# Patient Record
Sex: Male | Born: 1960 | Race: White | Hispanic: No | Marital: Married | Smoking: Never smoker
Health system: Southern US, Community
[De-identification: ages and names within clinical notes are randomized; demographics above are authoritative.]

## PROBLEM LIST (undated history)

## (undated) DIAGNOSIS — M5136 Other intervertebral disc degeneration, lumbar region: Secondary | ICD-10-CM

## (undated) DIAGNOSIS — Z87442 Personal history of urinary calculi: Secondary | ICD-10-CM

## (undated) DIAGNOSIS — F419 Anxiety disorder, unspecified: Secondary | ICD-10-CM

## (undated) DIAGNOSIS — C4490 Unspecified malignant neoplasm of skin, unspecified: Secondary | ICD-10-CM

## (undated) DIAGNOSIS — M51369 Other intervertebral disc degeneration, lumbar region without mention of lumbar back pain or lower extremity pain: Secondary | ICD-10-CM

## (undated) DIAGNOSIS — Z9189 Other specified personal risk factors, not elsewhere classified: Secondary | ICD-10-CM

## (undated) DIAGNOSIS — R112 Nausea with vomiting, unspecified: Secondary | ICD-10-CM

## (undated) DIAGNOSIS — T7840XA Allergy, unspecified, initial encounter: Secondary | ICD-10-CM

## (undated) DIAGNOSIS — M1990 Unspecified osteoarthritis, unspecified site: Secondary | ICD-10-CM

## (undated) DIAGNOSIS — E041 Nontoxic single thyroid nodule: Secondary | ICD-10-CM

## (undated) DIAGNOSIS — Z9889 Other specified postprocedural states: Secondary | ICD-10-CM

## (undated) DIAGNOSIS — R194 Change in bowel habit: Secondary | ICD-10-CM

## (undated) DIAGNOSIS — L570 Actinic keratosis: Secondary | ICD-10-CM

## (undated) DIAGNOSIS — K5900 Constipation, unspecified: Secondary | ICD-10-CM

## (undated) HISTORY — DX: Nontoxic single thyroid nodule: E04.1

## (undated) HISTORY — DX: Other intervertebral disc degeneration, lumbar region: M51.36

## (undated) HISTORY — DX: Constipation, unspecified: K59.00

## (undated) HISTORY — DX: Other intervertebral disc degeneration, lumbar region without mention of lumbar back pain or lower extremity pain: M51.369

## (undated) HISTORY — PX: MEDIAL PARTIAL KNEE REPLACEMENT: SHX5965

## (undated) HISTORY — PX: MOHS SURGERY: SHX181

## (undated) HISTORY — DX: Change in bowel habit: R19.4

## (undated) HISTORY — DX: Anxiety disorder, unspecified: F41.9

## (undated) HISTORY — DX: Unspecified osteoarthritis, unspecified site: M19.90

## (undated) HISTORY — PX: ACHILLES TENDON REPAIR: SUR1153

## (undated) HISTORY — DX: Nausea with vomiting, unspecified: R11.2

## (undated) HISTORY — DX: Allergy, unspecified, initial encounter: T78.40XA

## (undated) HISTORY — DX: Other specified postprocedural states: Z98.890

---

## 2005-05-01 ENCOUNTER — Encounter: Admission: RE | Admit: 2005-05-01 | Discharge: 2005-05-01 | Payer: Self-pay | Admitting: Internal Medicine

## 2005-05-01 ENCOUNTER — Other Ambulatory Visit: Admission: RE | Admit: 2005-05-01 | Discharge: 2005-05-01 | Payer: Self-pay | Admitting: Interventional Radiology

## 2005-05-01 ENCOUNTER — Encounter (INDEPENDENT_AMBULATORY_CARE_PROVIDER_SITE_OTHER): Payer: Self-pay | Admitting: *Deleted

## 2006-12-15 ENCOUNTER — Ambulatory Visit (HOSPITAL_BASED_OUTPATIENT_CLINIC_OR_DEPARTMENT_OTHER): Admission: RE | Admit: 2006-12-15 | Discharge: 2006-12-15 | Payer: Self-pay | Admitting: Orthopedic Surgery

## 2008-06-21 ENCOUNTER — Encounter: Admission: RE | Admit: 2008-06-21 | Discharge: 2008-06-21 | Payer: Self-pay | Admitting: Sports Medicine

## 2009-01-20 HISTORY — PX: FOOT SURGERY: SHX648

## 2009-03-18 ENCOUNTER — Emergency Department (HOSPITAL_COMMUNITY): Admission: EM | Admit: 2009-03-18 | Discharge: 2009-03-18 | Payer: Self-pay | Admitting: Family Medicine

## 2009-09-07 IMAGING — CR DG FOOT COMPLETE 3+V*L*
3 series · 3 of 3 positions shown · non-contrast
Comparison: None

CLINICAL DATA: Left foot pain.

LEFT FOOT - COMPLETE 3+ VIEW

[view not recorded (1 of 3)]
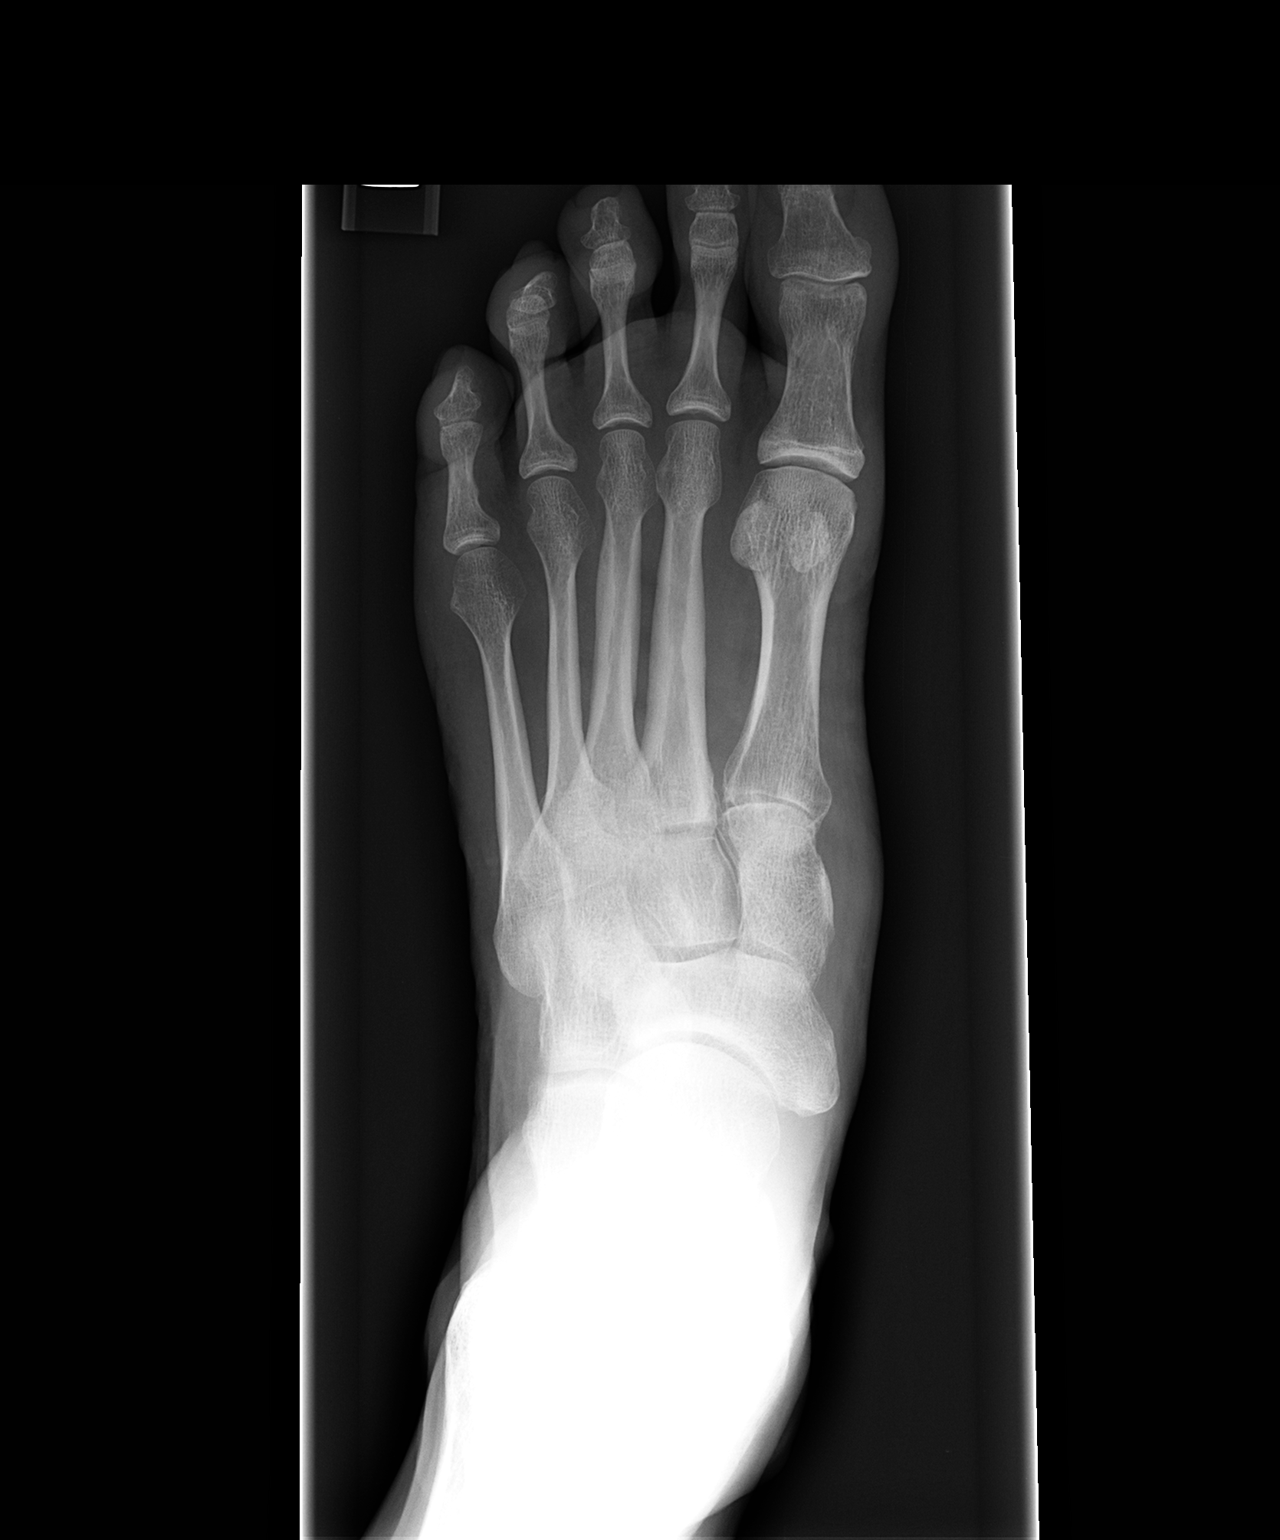

[view not recorded (2 of 3)]
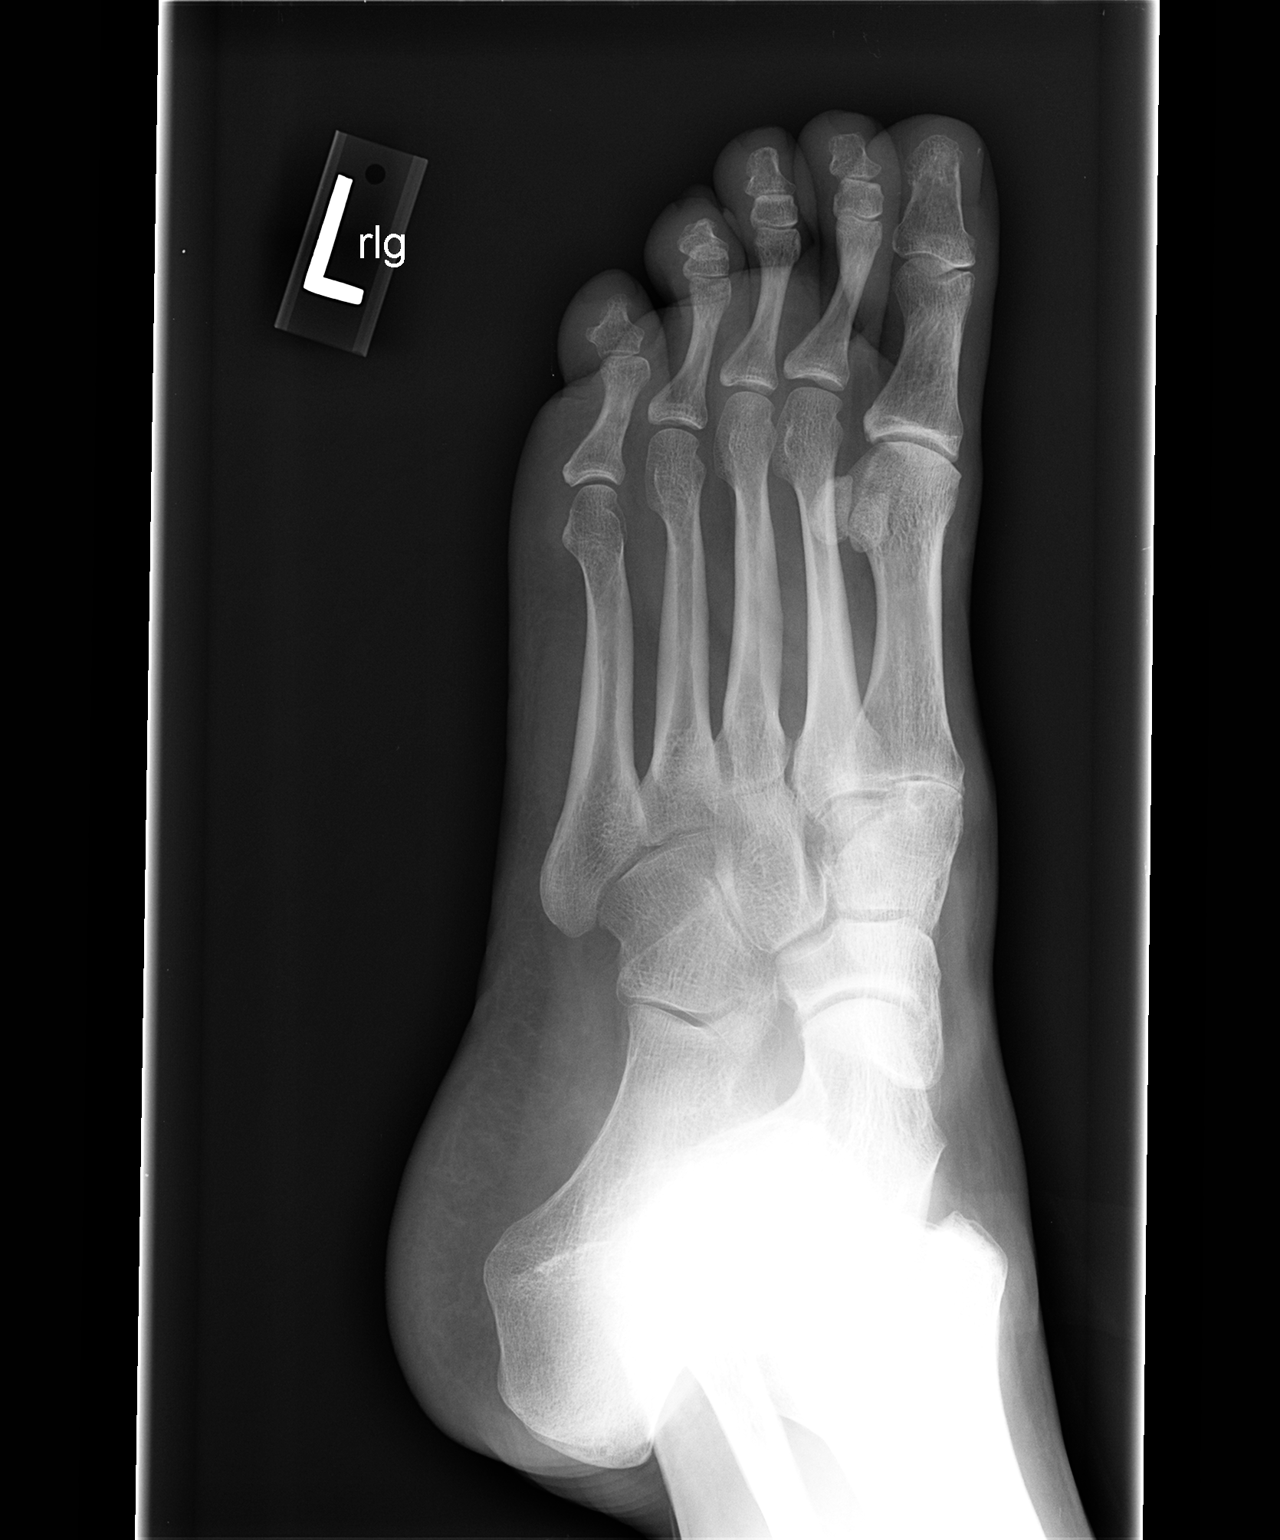

[view not recorded (3 of 3)]
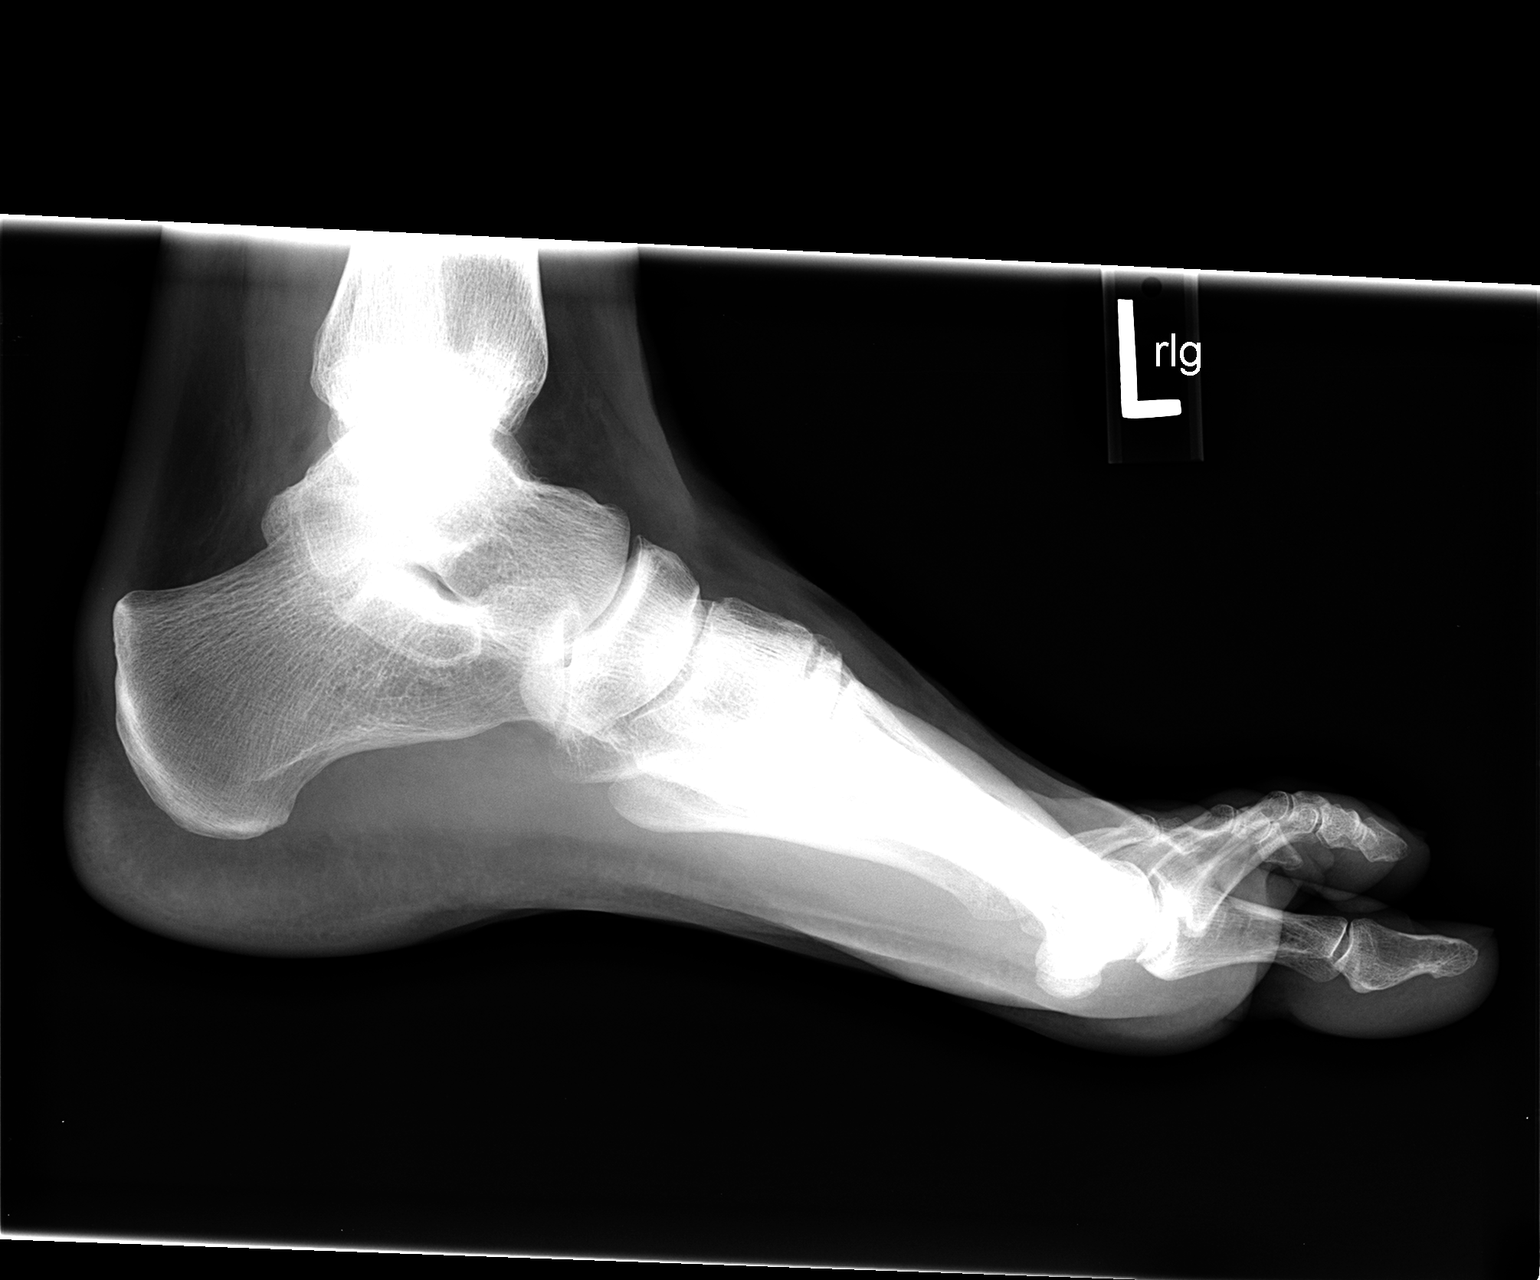

[3 of 3 positions shown; findings below may reference images not displayed]

FINDINGS: Mild degenerative changes are seen at the first tarsal
metatarsal and first metatarsal phalangeal joints.  No fracture.
IMPRESSION: Degenerative changes at the first tarsometatarsal and first
metatarsophalangeal joints.

## 2010-06-04 NOTE — Op Note (Signed)
NAME:  JUNAID, WURZER                  ACCOUNT NO.:  [id number]   MEDICAL RECORD NO.:  [id number]          PATIENT TYPE:  AMB   LOCATION:  [facility]                         FACILITY:  [facility]   PHYSICIAN:  Madlyn Frankel. Charlann Boxer, M.D.  DATE OF BIRTH:  [birth year]   DATE OF PROCEDURE:  12/15/2006  DATE OF DISCHARGE:  12/15/2006                               OPERATIVE REPORT   PREOPERATIVE DIAGNOSIS:  Bilateral knee chondromalacia, patella.   POSTOPERATIVE DIAGNOSIS:  Left knee:  1. Grade 3 chondral defects to the lateral aspect of the medial      femoral condyle weightbearing surface.  2. Small posterior horn medial meniscal tear.  3. Grade 3-4 chondromalacia to the patellofemoral with a quarter-sized      the area in the trochlea of grade 4 change down to bone, with      patella contacted deep, flexion greater than 90 degrees.   PROCEDURES:  1. Left knee diagnostic and operative arthroscopy, medial and      patellofemoral chondroplasty, with trochlear microfracture or      patellofemoral microfracture.  2. Partial medial meniscectomy.  3. Synovectomy.   ASSISTANT:  None.   ANESTHESIA:  General plus locally administered medications.   COMPLICATIONS:  None.   DRAINS:  None.   INDICATIONS FOR PROCEDURE:  Dalton Lopez is a 50 year old male whom I have  been following for bilateral chondromalacia, known patellofemoral  chondromalacia.  He was failing conservative measures of injections of  viscous supplementation, had a history of right knee arthroscopy in the  past with lateral release.  Given the persistence of his symptoms and  healing age, we reviewed conservative surgical options including  arthroscopy.  He is here for bilateral left knee arthroscopies, left  knee first.  Risks and benefits of this type of procedure were reviewed,  consent obtained prior night.   PROCEDURE IN DETAIL:  The patient was brought to operative theater.  Once adequate anesthesia, preoperative antibiotics, Ancef,  administered,  the patient was positioned supine.  Both lower extremities were placed  in leg holders and prepped and draped simultaneously.   Attention was first directed to his left knee.  Following infiltration  of portal sites with 1% lidocaine with epinephrine, standard inferior  lateral, inferior medial, and superior medial portals were utilized.  Diagnostic evaluation of the knee revealed the above-noted findings.  Through an inferior medial portal, this was my working portal the whole  time, first attention was directed to the medial compartment.  The  chondral flap on the lateral aspect of the medial femoral condyle was  addressed with a shaver, taking it back to a stable level.  Probe  examination was carried out.  Posterior horn medial meniscus had a small  radial tear which I bit back with a small biting basket, and then  contoured it to 3.5 with the shaver.  The remainder of this compartment  looked intact.  The ACL was intact.  The lateral compartment intact.   Anteriorly, an extensive synovectomy was carried out, revealing these  changes to the patellofemoral compartment.  The patella was noted  to be  relatively intact, with perhaps some grade 2 change.  At deep flexion at  greater than 90 degrees, there was this area in the trochlea that had  grade 3-4 changes.  I used a 3.5 shaver to debride this area, getting it  back down to about the size of a quarter or a little bit smaller.  I did  perform 5 microfracture holes into this area.  The idea was to stimulate  some fibrocartilage development to fill in this gap.   Reexamination of the knee revealed no evidence of any loose fragments of  cartilage.  Thus, the instrumentation was removed.  I injected the knee  with 30 mL of 0.25% Marcaine with epinephrine.  The portal sites were  reapproximated using 4-0 nylon.  This knee was then dressed into a  sterile bulky wrap on the field.   Attention was now directed to the right  knee, dictated under separate  note.      Madlyn Frankel Charlann Boxer, M.D.  Electronically Signed     MDO/MEDQ  D:  12/17/2006  T:  12/17/2006  Job:  [id number]

## 2010-06-04 NOTE — Op Note (Signed)
NAME:  Dalton Lopez, Dalton Lopez                  ACCOUNT NO.:  [id number]   MEDICAL RECORD NO.:  [id number]          PATIENT TYPE:  AMB   LOCATION:  [facility]                         FACILITY:  [facility]   PHYSICIAN:  Madlyn Frankel. Charlann Boxer, M.D.  DATE OF BIRTH:  [birth year]   DATE OF PROCEDURE:  12/15/2006  DATE OF DISCHARGE:  12/15/2006                               OPERATIVE REPORT   PREOPERATIVE DIAGNOSIS:  Bilateral chondromalacia of patella.   POSTOPERATIVE DIAGNOSIS:  Right knee diffuse chondromalacia of patella  with grade 3-4 changes, grade 3 changes noted in the medial lateral  aspect of the medial femoral condyle, associated with synovitic  response.   PROCEDURE:  Right knee diagnostic and operative arthroscopy with medial  and patellofemoral chondroplasty and synovectomy.   SURGEON:  Madlyn Frankel. Charlann Boxer, M.D.   ASSISTANT:  None.   ANESTHESIA:  General plus locally administered anesthetic.   INDICATIONS FOR PROCEDURE:  The patient is a 50 year old male with  bilateral knee chondromalacia of patella, failing conservative measures  and wishing to proceed with bilateral knee arthroscopies for chondral  debridement as a conservative approach of managing this in a 50 year old  male.  Risks and benefits discussed including, but not limited to the  risks of potential need for future surgery and failure of this type of  surgical approach.  Consent obtained.   PROCEDURE IN DETAIL:  The patient was brought to the operative theater.  As previously noted, both lower extremities were placed in leg holders,  prepped and draped in sterile fashion.  Following the procedure on the  left knee, which was dressed into a sterile wrap, attention was now  directed to the right knee.  Previous portal sites were utilized,  inferior lateral, superior medial, inferior medial.  Diagnostic  evaluation of the knee revealed the above-noted findings.  Inferior  medial portal was utilized.  Attention was first directed with a 3.5  _____________ shaver to debride this area with a small flap on the  lateral aspect of the medial femoral condyle.  It is my assumption that  his index right knee arthroscopy revealed the same findings I found on  the left knee and it was subsequently debrided back to a stable level  given the appearance that it had.  There was a flap present that was  debrided back to a stable level.  At this point, the meniscus was  intact, ACL intact and the lateral compartment intact.   Anterior extensive synovectomy revealed this diffuse grade 3-4 changes  into this femoral trochlear area.  The patella was relatively intact  other than some medial facet changes.  This diffuse change was quite  extensive.  There was no microfracture carried out given the diffuse  area that was covered.  There was no eburnated bone, but there was a  very thin layer of cartilage present.  I debrided back flaps of  cartilage that were present, any unstable cartilage that was there.  Following this, I reexamined the knee to make sure there was no evidence  of any further cartilage.  Once I was satisfied that  there was nothing  further that could be done arthroscopically, I removed the  instrumentation.  The portal sites were reapproximated using 4-0 nylon.  I injected this knee too with 30 mL of quarter percent lidocaine with  epinephrine.   At this point, his knee was draped into a sterile bulky wrap.  He was  brought to the recovery room extubated in stable condition, tolerating  the procedures well.  I will see him back in the office in routine  followup.  We will initiate some physical therapy and try to preserve  these knees as long as possible.      Madlyn Frankel Charlann Boxer, M.D.  Electronically Signed     MDO/MEDQ  D:  12/17/2006  T:  12/17/2006  Job:  [id number]

## 2010-10-21 ENCOUNTER — Other Ambulatory Visit: Payer: Self-pay | Admitting: Dermatology

## 2011-11-21 HISTORY — PX: KNEE ARTHROSCOPY: SUR90

## 2011-11-27 ENCOUNTER — Ambulatory Visit (INDEPENDENT_AMBULATORY_CARE_PROVIDER_SITE_OTHER): Payer: BC Managed Care – PPO

## 2011-11-27 DIAGNOSIS — Z23 Encounter for immunization: Secondary | ICD-10-CM

## 2012-02-16 ENCOUNTER — Other Ambulatory Visit: Payer: Self-pay | Admitting: Dermatology

## 2012-05-17 ENCOUNTER — Other Ambulatory Visit: Payer: Self-pay | Admitting: Dermatology

## 2012-09-23 ENCOUNTER — Encounter: Payer: Self-pay | Admitting: Internal Medicine

## 2012-10-28 ENCOUNTER — Encounter: Payer: BC Managed Care – PPO | Admitting: Internal Medicine

## 2012-11-15 ENCOUNTER — Other Ambulatory Visit: Payer: Self-pay | Admitting: Dermatology

## 2012-11-25 ENCOUNTER — Ambulatory Visit (AMBULATORY_SURGERY_CENTER): Payer: Self-pay

## 2012-11-25 VITALS — Ht 74.0 in | Wt 199.2 lb

## 2012-11-25 DIAGNOSIS — Z1211 Encounter for screening for malignant neoplasm of colon: Secondary | ICD-10-CM

## 2012-11-25 MED ORDER — MOVIPREP 100 G PO SOLR: ORAL | Status: DC

## 2012-11-25 MED ORDER — MOVIPREP 100 G PO SOLR: 1.0000 | Freq: Once | ORAL | Status: DC

## 2012-11-26 ENCOUNTER — Encounter: Payer: Self-pay | Admitting: Internal Medicine

## 2012-12-10 ENCOUNTER — Ambulatory Visit (AMBULATORY_SURGERY_CENTER): Payer: BC Managed Care – PPO | Admitting: Internal Medicine

## 2012-12-10 ENCOUNTER — Encounter: Payer: Self-pay | Admitting: Internal Medicine

## 2012-12-10 VITALS — BP 112/75 | HR 63 | Temp 97.7°F | Resp 20 | Ht 74.0 in | Wt 199.0 lb

## 2012-12-10 DIAGNOSIS — Z1211 Encounter for screening for malignant neoplasm of colon: Secondary | ICD-10-CM

## 2012-12-10 MED ORDER — SODIUM CHLORIDE 0.9 % IV SOLN: 500.0000 mL | INTRAVENOUS | Status: DC

## 2012-12-10 NOTE — Progress Notes (Signed)
   [facility] Anesthesia Post-op Note  Patient: Dalton Lopez  Procedure(s) Performed: colonoscopy  Patient Location: [facility] - Recovery Area  Anesthesia Type: Deep Sedation/Propofol  Level of Consciousness: awake, oriented and patient cooperative  Airway and Oxygen Therapy: Patient Spontanous Breathing  Post-op Pain: none  Post-op Assessment:  Post-op Vital signs reviewed, Patient's Cardiovascular Status Stable, Respiratory Function Stable, Patent Airway, No signs of Nausea or vomiting and Pain level controlled  Post-op Vital Signs: Reviewed and stable  Complications: No apparent anesthesia complications  Blythe Hartshorn E 11:44 AM

## 2012-12-10 NOTE — Op Note (Signed)
[facility] [address] [place], [address]   COLONOSCOPY PROCEDURE REPORT  PATIENT: Dalton Lopez, Dalton Lopez  MR#: [id number] BIRTHDATE: [birth year] , 52  yrs. old GENDER: Male ENDOSCOPIST: Roxy Cedar, MD REFERRED BM:[id number] Jacky Kindle, M.D. PROCEDURE DATE:  12/10/2012 PROCEDURE:   Colonoscopy, screening First Screening Colonoscopy - Avg.  risk and is 50 yrs.  old or older Yes.  Prior Negative Screening - Now for repeat screening. N/A  History of Adenoma - Now for follow-up colonoscopy & has been > or = to 3 yrs.  N/A  Polyps Removed Today? No.  Recommend repeat exam, <10 yrs? No. ASA CLASS:   Class I INDICATIONS:average risk screening. MEDICATIONS: MAC sedation, administered by CRNA and propofol (Diprivan) 550mg  IV  DESCRIPTION OF PROCEDURE:   After the risks benefits and alternatives of the procedure were thoroughly explained, informed consent was obtained.  A digital rectal exam revealed no abnormalities of the rectum.   The LB [id number]  endoscope was introduced through the anus and advanced to the cecum, which was identified by both the appendix and ileocecal valve. No adverse events experienced.   The quality of the prep was good, using MoviPrep  The instrument was then slowly withdrawn as the colon was fully examined.   COLON FINDINGS: A normal appearing cecum, ileocecal valve, and appendiceal orifice were identified.  The ascending, hepatic flexure, transverse, splenic flexure, descending, sigmoid colon and rectum appeared unremarkable.  No polyps or cancers were seen. Retroflexed views revealed internal hemorrhoids. The time to cecum=5 minutes 38 seconds.  Withdrawal time=13 minutes 30 seconds. The scope was withdrawn and the procedure completed. COMPLICATIONS: There were no complications.  ENDOSCOPIC IMPRESSION: 1. Normal colon  RECOMMENDATIONS: 1. Continue current colorectal screening recommendations for "routine risk" patients with a repeat  colonoscopy in 10 years.   eSigned:  Roxy Cedar, MD 12/10/2012 11:41 AM   cc: Geoffry Paradise, MD and The Patient

## 2012-12-10 NOTE — Patient Instructions (Signed)
Normal Colonoscopy  YOU HAD AN ENDOSCOPIC PROCEDURE TODAY AT THE [facility]: Refer to the procedure report that was given to you for any specific questions about what was found during the examination.  If the procedure report does not answer your questions, please call your gastroenterologist to clarify.  If you requested that your care partner not be given the details of your procedure findings, then the procedure report has been included in a sealed envelope for you to review at your convenience later.  YOU SHOULD EXPECT: Some feelings of bloating in the abdomen. Passage of more gas than usual.  Walking can help get rid of the air that was put into your GI tract during the procedure and reduce the bloating. If you had a lower endoscopy (such as a colonoscopy or flexible sigmoidoscopy) you may notice spotting of blood in your stool or on the toilet paper. If you underwent a bowel prep for your procedure, then you may not have a normal bowel movement for a few days.  DIET: Your first meal following the procedure should be a light meal and then it is ok to progress to your normal diet.  A half-sandwich or bowl of soup is an example of a  good first meal.  Heavy or fried foods are harder to digest and may make you feel nauseous or bloated.  Likewise meals heavy in dairy and vegetables can cause extra gas to form and this can also increase the bloating.  Drink plenty of fluids but you should avoid alcoholic beverages for 24 hours.  ACTIVITY: Your care partner should take you home directly after the procedure.  You should plan to take it easy, moving slowly for the rest of the day.  You can resume normal activity the day after the procedure however you should NOT DRIVE or use heavy machinery for 24 hours (because of the sedation medicines used during the test).    SYMPTOMS TO REPORT IMMEDIATELY: A gastroenterologist can be reached at any hour.  During normal business hours, 8:30 AM to 5:00 PM  Monday through Friday, call [phone].  After hours and on weekends, please call the GI answering service at [phone] who will take a message and have the physician on call contact you.   Following lower endoscopy (colonoscopy or flexible sigmoidoscopy):  Excessive amounts of blood in the stool  Significant tenderness or worsening of abdominal pains  Swelling of the abdomen that is new, acute  Fever of 100F or higher  FOLLOW UP: If any biopsies were taken you will be contacted by phone or by letter within the next 1-3 weeks.  Call your gastroenterologist if you have not heard about the biopsies in 3 weeks.  Our staff will call the home number listed on your records the next business day following your procedure to check on you and address any questions or concerns that you may have at that time regarding the information given to you following your procedure. This is a courtesy call and so if there is no answer at the home number and we have not heard from you through the emergency physician on call, we will assume that you have returned to your regular daily activities without incident.  SIGNATURES/CONFIDENTIALITY: You and/or your care partner have signed paperwork which will be entered into your electronic medical record.  These signatures attest to the fact that that the information above on your After Visit Summary has been reviewed and is understood.  Full responsibility of   the confidentiality of this discharge information lies with you and/or your care-partner. 

## 2012-12-10 NOTE — Progress Notes (Signed)
Patient did not have preoperative order for IV antibiotic SSI prophylaxis. (G8918)  Patient did not experience any of the following events: a burn prior to discharge; a fall within the facility; wrong site/side/patient/procedure/implant event; or a hospital transfer or hospital admission upon discharge from the facility. (G8907)  

## 2012-12-13 ENCOUNTER — Telehealth: Payer: Self-pay | Admitting: *Deleted

## 2012-12-13 NOTE — Telephone Encounter (Signed)
  Follow up Call-  Call back number 12/10/2012  Post procedure Call Back phone  # [phone]  Permission to leave phone message Yes     Patient questions:  Do you have a fever, pain , or abdominal swelling? no Pain Score  0 *  Have you tolerated food without any problems? yes  Have you been able to return to your normal activities? yes  Do you have any questions about your discharge instructions: Diet   no Medications  no Follow up visit  no  Do you have questions or concerns about your Care? no  Actions: * If pain score is 4 or above: No action needed, pain <4.

## 2013-05-16 ENCOUNTER — Other Ambulatory Visit: Payer: Self-pay | Admitting: Dermatology

## 2013-06-15 ENCOUNTER — Other Ambulatory Visit: Payer: Self-pay | Admitting: Dermatology

## 2013-06-17 ENCOUNTER — Other Ambulatory Visit: Payer: Self-pay | Admitting: Urology

## 2013-06-21 ENCOUNTER — Other Ambulatory Visit: Payer: Self-pay | Admitting: Urology

## 2013-06-23 ENCOUNTER — Encounter (HOSPITAL_COMMUNITY): Payer: Self-pay | Admitting: Pharmacy Technician

## 2013-07-01 ENCOUNTER — Encounter (HOSPITAL_BASED_OUTPATIENT_CLINIC_OR_DEPARTMENT_OTHER): Payer: Self-pay

## 2013-07-01 ENCOUNTER — Ambulatory Visit (HOSPITAL_BASED_OUTPATIENT_CLINIC_OR_DEPARTMENT_OTHER): Admit: 2013-07-01 | Payer: Self-pay | Admitting: Urology

## 2013-07-01 SURGERY — CYSTOURETEROSCOPY, WITH STENT INSERTION
Anesthesia: General | Laterality: Left

## 2013-07-13 ENCOUNTER — Encounter (HOSPITAL_COMMUNITY): Payer: Self-pay | Admitting: *Deleted

## 2013-07-13 NOTE — Progress Notes (Signed)
Patient notified that ESWL to be rescheduled due to his taking voltaren less than 48 hours prior to procedure. He is very upset and wants to talk to Dr Junious Silk. Phone number provided.

## 2013-07-13 NOTE — Progress Notes (Signed)
History obtained for ESWL on 07/14/13. Patient has taken voltaren 25 mg PO 07/12/13 at 1800. Notified Dr Lyndal Rainbow nurse about this. She will talk to Dr Junious Silk about this and call back.

## 2013-07-13 NOTE — H&P (Signed)
History of Present Illness F/u -  PCP is Dr. Reynaldo Minium.         1-  nephrolithiasis - long history of stones and was on Urocit-K in the past as he had a 24-hour urine collection in Oct 2005. He was advised to start UrocitK, increase fluid intake and limit oxalate. He passed his first stone in 1995.   -Feb 2015 - flank pain, CT A/P - 8 mm right lower pole stone, 6 mm left proximal ureteral stone. I reviewed all the images. KUB - an 8 mm right lower pole stone. The left ureteral stone has now progressed into the left mid ureter over the sacrum. There are multiple pelvic phleboliths.      May 2015 interval hx  Mr. Perkins returns and has not passed a stone. He hasn't had a lot in the way of pain but did note some left lower quadrant discomfort after he stopped tamsulosin.   Past Medical History Problems  1. History of Arthritis (V13.4)  Surgical History Problems  1. History of Arthroscopy Knee Left 2. History of Arthroscopy Knee Right 3. History of Liposuction 4. History of Primary Repair Of Ruptured Achilles Tendon 5. History of Tonsillectomy  Current Meds 1. Diclofenac Sodium TBEC;  Therapy: (Recorded:12Feb2015) to Recorded 2. Fish Oil CAPS;  Therapy: (Recorded:27Jan2009) to Recorded 3. Fluticasone Propionate 50 MCG/ACT Nasal Suspension;  Therapy: 14Apr2014 to Recorded 4. Multi-Vitamin TABS;  Therapy: (Recorded:27Jan2009) to Recorded 5. OxyCODONE HCl CAPS;  Therapy: (Recorded:12Feb2015) to Recorded 6. Potassium Citrate ER 10 MEQ (1080 MG) Oral Tablet Extended Release; TAKE TWO  TABLETS TWICE DAILY AFTER MEALS;  Therapy: 27Jan2009 to (Evaluate:09May2015)  Requested for: [id number]; Last  Rx:14May2014 Ordered 7. Promethazine HCl - 25 MG Oral Tablet; TAKE 1 TABLET EVERY 4 TO 6 HOURS AS  NEEDED FOR NAUSEA;  Therapy: 18HUD1497 to (Evaluate:17Feb2015)  Requested for: 02OVZ8588; Last  Rx:12Feb2015 Ordered 8. Tamsulosin HCl - 0.4 MG Oral Capsule; TAKE 1 CAPSULE Daily;  Therapy: [phone] to (Evaluate:26Apr2015)  Requested for: [phone]; Last  Rx:25Feb2015 Ordered 9. TraMADol HCl - 50 MG Oral Tablet; TAKE 1 TABLET EVERY 6 HOURS AS NEEDED FOR  PAIN;  Therapy: 47SJG2836 to (Evaluate:19Feb2015); Last Rx:12Feb2015 Ordered  Allergies Medication  1. No Known Drug Allergies Non-Medication  2. Pollen  Family History Problems  1. Family history of Family Health Status Number Of Children   One son and one daughter 2. Family history of Nephrolithiasis : Father  Social History Problems  1. Alcohol Use   One a day 2. Caffeine Use   3 per day 3. Former Smoker   Smoked in college 4. Marital History - Currently Married 5. Occupation:   Optician, dispensing Vital Signs [Data Includes: Last 1 Day]  Recorded: 62HUT6546 02:55PM  Blood Pressure: 122 / 77 Temperature: 98.4 F Heart Rate: 68  Physical Exam Constitutional: Well nourished and well developed . No acute distress.  Pulmonary: No respiratory distress and normal respiratory rhythm and effort.  Neuro/Psych:. Mood and affect are appropriate.    Results/Data Urine [Data Includes: Last 1 Day]   [id number]  COLOR YELLOW   APPEARANCE CLOUDY   SPECIFIC GRAVITY 1.015   pH 7.0   GLUCOSE NEG mg/dL  BILIRUBIN NEG   KETONE NEG mg/dL  BLOOD NEG   PROTEIN NEG mg/dL  UROBILINOGEN 0.2 mg/dL  NITRITE NEG   LEUKOCYTE ESTERASE NEG   SQUAMOUS EPITHELIAL/HPF NONE SEEN   WBC 3-6 WBC/hpf  RBC 0-2 RBC/hpf  BACTERIA MODERATE   CRYSTALS NONE SEEN  CASTS NONE SEEN   Other AMORPHOUS NOTED    Procedure KUB today-comparison to prior CT and KUB, findings: The bone and the bowel gas pattern appear normal. The left mid ureteral stone has progressed into the left distal ureter at the ureterovesical junction. No other stones were noted.     Assessment Assessed  1. Kidney stone on right side (592.0) 2. Calculus of left ureter (592.1)  Plan Calculus of left ureter  1. Follow-up Schedule Surgery Office   Follow-up  Status: Complete  Done: [id number] 2. Follow-up Weeks 2-3 Office  Follow-up  Status: Hold For - Appointment,Date of Service   Requested for: 25Feb2015 3. KUB; Status:Hold For - Appointment,Date of Service; Requested for:25Feb2015;  Health Maintenance  4. UA With REFLEX; [Do Not Release]; Status:Complete;   Done: 74QVZ5638 02:38PM Kidney stone on right side  5. KUB; Status:Complete;   Done: 75IEP3295 12:00AM  Discussion/Summary Discussed with patient the stone is at the left ureterovesical junction. I reviewed the images with him. We discussed the nature risk and benefits of restarting alpha blocker and continuing stone passage, ureteroscopy or shockwave lithotripsy. We went over this information in detail. He elected to proceed with ureteroscopy. We discussed risk of ureteral injury, bleeding, infection, failure to gain retrograde access, need for staged procedure/pre-stenting among others. We discussed what to expect with a ureteral stent and that it would be removed with a string or in the office with a camera. On occasion we do not have to leave a stent. All questions answered.        Signatures Electronically signed by : Festus Aloe, M.D.; Jun 16 2013  4:16PM EST

## 2013-07-14 ENCOUNTER — Encounter (HOSPITAL_COMMUNITY)
Admission: RE | Disposition: A | Discharge status: Home / Self Care | Payer: Self-pay | Source: Ambulatory Visit | Attending: Urology

## 2013-07-14 ENCOUNTER — Encounter (HOSPITAL_COMMUNITY): Payer: Self-pay | Admitting: *Deleted

## 2013-07-14 ENCOUNTER — Ambulatory Visit (HOSPITAL_COMMUNITY)
Admission: RE | Admit: 2013-07-14 | Discharge: 2013-07-14 | Disposition: A | Discharge status: Home / Self Care | Payer: BC Managed Care – PPO | Source: Ambulatory Visit | Attending: Urology | Admitting: Urology

## 2013-07-14 ENCOUNTER — Ambulatory Visit (HOSPITAL_COMMUNITY): Payer: BC Managed Care – PPO

## 2013-07-14 DIAGNOSIS — N200 Calculus of kidney: Secondary | ICD-10-CM | POA: Insufficient documentation

## 2013-07-14 DIAGNOSIS — Z87891 Personal history of nicotine dependence: Secondary | ICD-10-CM | POA: Insufficient documentation

## 2013-07-14 DIAGNOSIS — N201 Calculus of ureter: Secondary | ICD-10-CM | POA: Insufficient documentation

## 2013-07-14 DIAGNOSIS — Z79899 Other long term (current) drug therapy: Secondary | ICD-10-CM | POA: Insufficient documentation

## 2013-07-14 HISTORY — DX: Personal history of urinary calculi: Z87.442

## 2013-07-14 SURGERY — LITHOTRIPSY, ESWL
Anesthesia: LOCAL | Laterality: Left

## 2013-07-14 MED ORDER — CEFAZOLIN SODIUM-DEXTROSE 2-3 GM-% IV SOLR: 2.0000 g | INTRAVENOUS | Status: DC

## 2013-07-14 MED ORDER — SODIUM CHLORIDE 0.9 % IV SOLN
INTRAVENOUS | Status: DC
  Administered 2013-07-14: 13:00:00 via INTRAVENOUS

## 2013-07-14 MED ORDER — CIPROFLOXACIN HCL 500 MG PO TABS
500.0000 mg | ORAL_TABLET | ORAL | Status: AC
Start: 2013-07-14 — End: 2013-07-14
  Administered 2013-07-14: 500 mg via ORAL
  Filled 2013-07-14: qty 1

## 2013-07-14 MED ORDER — OXYCODONE-ACETAMINOPHEN 5-325 MG PO TABS: 1.0000 | ORAL_TABLET | Freq: Four times a day (QID) | ORAL | Status: DC | PRN

## 2013-07-14 MED ORDER — DIPHENHYDRAMINE HCL 25 MG PO CAPS
25.0000 mg | ORAL_CAPSULE | ORAL | Status: AC
  Administered 2013-07-14: 25 mg via ORAL
  Filled 2013-07-14: qty 1

## 2013-07-14 MED ORDER — DIAZEPAM 5 MG PO TABS
10.0000 mg | ORAL_TABLET | ORAL | Status: AC
  Administered 2013-07-14: 10 mg via ORAL
  Filled 2013-07-14: qty 2

## 2013-07-14 MED ORDER — DICLOFENAC SODIUM 25 MG PO TBEC: 25.0000 mg | DELAYED_RELEASE_TABLET | Freq: Two times a day (BID) | ORAL | Status: DC

## 2013-07-14 MED ORDER — TAMSULOSIN HCL 0.4 MG PO CAPS: 0.4000 mg | ORAL_CAPSULE | Freq: Every day | ORAL | Status: DC

## 2013-07-14 NOTE — Discharge Instructions (Signed)

## 2013-07-14 NOTE — Op Note (Signed)
See [facility] scanned op note -  Left distal ureteral stone - triangular shape on KUB Left ESWL

## 2013-07-14 NOTE — Interval H&P Note (Signed)
History and Physical Interval Note:  07/14/2013 1:49 PM  Dalton Lopez  has presented today for surgery, with the diagnosis of Left Ureteral Stone  The various methods of treatment have been discussed with the patient and family. After consideration of risks, benefits and other options for treatment, the patient has consented to  Procedure(s): LEFT EXTRACORPOREAL SHOCK WAVE LITHOTRIPSY (ESWL) (Left) as a surgical intervention .  The patient's history has been reviewed, patient examined, no change in status, stable for surgery.  I have reviewed the patient's chart and labs.  Questions were answered to the patient's satisfaction. No fever or dysuria. No stone passage. Todays KUB shows stone stable at left UVJ.    Festus Aloe

## 2013-11-17 ENCOUNTER — Other Ambulatory Visit: Payer: Self-pay | Admitting: Dermatology

## 2013-11-17 HISTORY — PX: SKIN BIOPSY: SHX1

## 2013-12-19 ENCOUNTER — Encounter: Payer: Self-pay | Admitting: Radiation Oncology

## 2013-12-19 NOTE — Progress Notes (Signed)
Histology and Location of Primary Skin Cancer: nose tip  Dalton Lopez presented with the following signs/symptoms,  Follow up on skin cancers   Past/Anticipated interventions by patient's surgeon/dermatologist for current problematic lesion, if any: nose tip  Past skin cancers, if any: Multiple skin cancers including basal cell, squamous cell since 2008: leg, shoulder, back, upper chest, right chest, forehead, upper back, right temple, scalp, right preauricular, nasal tip. History of multiple treatment regimes, procedures.    1) Location/Histology/Intervention:   2) Location/Histology/Intervention:   3) Location/Histology/Intervention:   History of Blistering sunburns, if any: no, history of swimming outdoors x 7 years  SAFETY ISSUES:  Prior radiation? no  Pacemaker/ICD? no  Possible current pregnancy? na  Is the patient on methotrexate? no  Current Complaints / other details:  2 children

## 2013-12-20 ENCOUNTER — Ambulatory Visit
Admission: RE | Admit: 2013-12-20 | Discharge: 2013-12-20 | Disposition: A | Discharge status: Home / Self Care | Payer: 59 | Source: Ambulatory Visit | Attending: Radiation Oncology | Admitting: Radiation Oncology

## 2013-12-20 ENCOUNTER — Encounter: Payer: Self-pay | Admitting: Radiation Oncology

## 2013-12-20 VITALS — BP 126/78 | HR 89 | Temp 98.2°F | Resp 18 | Ht 74.0 in | Wt 203.9 lb

## 2013-12-20 DIAGNOSIS — C44311 Basal cell carcinoma of skin of nose: Secondary | ICD-10-CM | POA: Insufficient documentation

## 2013-12-20 HISTORY — DX: Unspecified malignant neoplasm of skin, unspecified: C44.90

## 2013-12-20 HISTORY — DX: Other specified personal risk factors, not elsewhere classified: Z91.89

## 2013-12-20 HISTORY — DX: Actinic keratosis: L57.0

## 2013-12-20 NOTE — Progress Notes (Signed)
Please see the Nurse Progress Note in the MD Initial Consult Encounter for this patient. 

## 2013-12-20 NOTE — Progress Notes (Signed)
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    HISTORY OF PRESENT ILLNESS:  Dalton Lopez is a 53 y.o. male who is seen today through the courtesy of Dr. Sarajane Jews for evaluation of his basal cell carcinoma of the left nasal tip.  He has a history of multiple head and neck skin carcinomas going back to 2010.  Approximately 2 months ago he noted a lesion along the tip of the nose.  This was biopsied on 11/17/2013 with the diagnosis of cell carcinoma with sclerosis.  Dr. Sarajane Jews offered him Mohs surgery with and without assistance of a plastic surgeon.  He is seen today for discussion of possible radiation therapy.  He is concerned about cosmesis.  PREVIOUS RADIATION THERAPY: No   PAST MEDICAL HISTORY:  has a past medical history of Anxiety; History of kidney stones; Actinic keratosis; History of moderate sun exposure; Arthritis; Allergy; and Skin cancer.     PAST SURGICAL HISTORY:  Past Surgical History  Procedure Laterality Date  . Knee atthrosopic  11/2011    Bil  . Achilles tendon repair      right   . Foot surgery  2011    joint fusion repair with screws left foot  . Skin biopsy  11/17/13    forehead, left temple, central nose tip  . Mohs surgery      multiple     FAMILY HISTORY: family history includes Asthma in his father; Cancer in his paternal grandmother; Skin cancer in his father. There is no history of Colon cancer.   SOCIAL HISTORY:  reports that he has never smoked. He has never used smokeless tobacco. He reports that he drinks about 1.8 oz of alcohol per week. He reports that he does not use illicit drugs. Married, 2 children.  He works in [employer] for a [employer].  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   ALLERGIES: Pollen extract   MEDICATIONS:  Current Outpatient Prescriptions  Medication Sig Dispense Refill  . fluticasone (FLONASE) 50 MCG/ACT nasal spray Place 1 spray into both nostrils daily.    Marland Kitchen loratadine (CLARITIN) 10 MG tablet Take 10 mg by mouth daily.    . meloxicam (MOBIC) 15 MG tablet Take 15 mg by mouth daily.    . Multiple Vitamin (MULTIVITAMIN WITH MINERALS) TABS tablet Take 1 tablet by mouth daily.    . Omega-3 Fatty Acids (FISH OIL) 1200 MG CAPS Take 1,200 mg by mouth daily.     Marland Kitchen oxyCODONE-acetaminophen (ROXICET) 5-325 MG per tablet Take 1-2 tablets by mouth every 6 (six) hours as needed for severe pain. 30 tablet 0  . PARoxetine (PAXIL) 10 MG tablet Take 10 mg by mouth at bedtime.      No current facility-administered medications for this encounter.     REVIEW OF SYSTEMS:  Pertinent items are noted in HPI.    PHYSICAL EXAM:  height is 6\' 2"  (1.88 m) and weight is 203 lb 14.4 oz (92.488 kg). His oral temperature is 98.2 F (36.8 C). His blood pressure is 126/78 and his pulse is 89. His respiration is 18.   On inspection of  the left aspect of the nasal tip there is a small erythematous depression with poorly defined surrounding sclerosis measuring approximately 1 cm in greatest dimensions as best I can determine.     LABORATORY DATA:  Lab Results  Component Value Date   HGB 16.2 12/15/2006   No results found for: NA, K, CL, CO2 No results found for: ALT, AST, GGT, ALKPHOS, BILITOT    IMPRESSION: Basal cell carcinoma of the left tip of the nose.  I explained to the patient that his management options include Mohs surgery or radiation therapy.  I explained to him that cosmesis is generally favorable with Mohs surgery unless there is a large tissue defect.  I do not think that this is the case in this situation.  I would not favor radiation therapy in view of his young age which would place  him at a small risk for development of a radiation-related malignancy at least 10-15 years down the road.  Furthermore, radiation therapy may be indicated  in the event that he is not a candidate for surgery in the distant future for a new or recurrent carcinoma.  He could "burn some bridges" at his relatively young age.  Lastly, I explained to him that cosmesis may be ideal with radiation therapy in terms of thinning of his dermis and development of telangiectasia.  For these reasons I favor surgery.  He tells me he may seek a plastic surgery opinion with Dr. Ralph Leyden at [facility] as suggested by Dr. Sarajane Jews.   PLAN: As above.  I spent 20  minutes face to face with the patient and more than 50% of that time was spent in counseling and/or coordination of care.

## 2014-09-30 DIAGNOSIS — H1010 Acute atopic conjunctivitis, unspecified eye: Secondary | ICD-10-CM

## 2014-09-30 DIAGNOSIS — J309 Allergic rhinitis, unspecified: Principal | ICD-10-CM

## 2014-09-30 IMAGING — CR DG ABDOMEN 1V
1 series · 1 of 1 positions shown · non-contrast
Comparison: None.

CLINICAL DATA: Pre lithotripsy

EXAM:
ABDOMEN - 1 VIEW

[t abdomen supine]
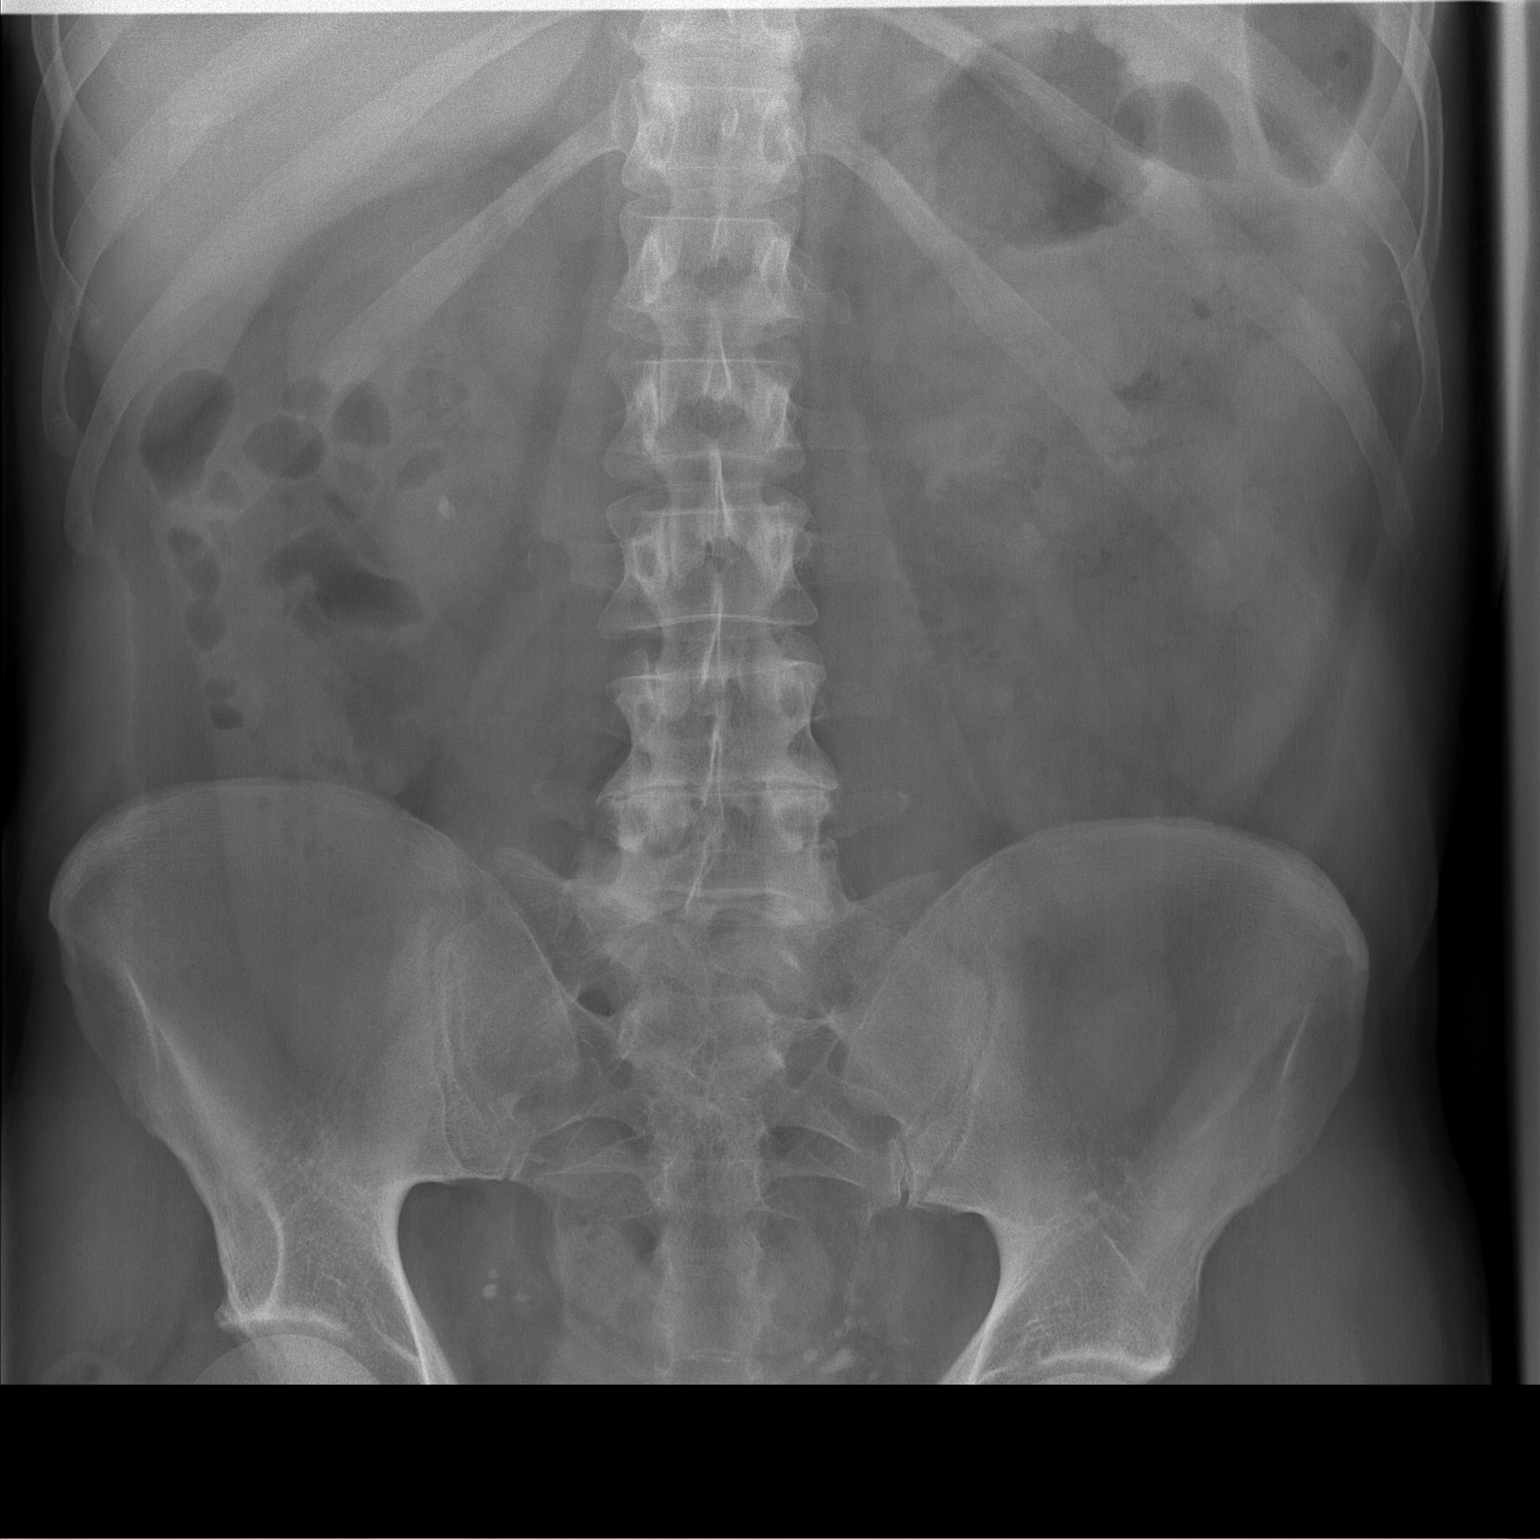

[1 of 1 positions shown; findings below may reference images not displayed]

FINDINGS: A supine film of the abdomen shows a 7 mm right lower pole renal
calculus. No definite left renal calculi are seen. Multiple
calcifications are noted in the bony pelvis, the majority of which
appear most typical of phleboliths, but a distal ureteral calculus
would be difficult to exclude. The bowel gas pattern is nonspecific.
There are degenerative changes at L4-5 and L5-S1 levels.
IMPRESSION: 1. 7 mm calculus overlies the lower pole of the right kidney.
2. No additional renal or ureteral calculi are noted.

## 2014-10-23 ENCOUNTER — Ambulatory Visit (INDEPENDENT_AMBULATORY_CARE_PROVIDER_SITE_OTHER): Payer: 59 | Admitting: *Deleted

## 2014-10-23 DIAGNOSIS — J309 Allergic rhinitis, unspecified: Secondary | ICD-10-CM | POA: Diagnosis not present

## 2014-10-27 ENCOUNTER — Ambulatory Visit (INDEPENDENT_AMBULATORY_CARE_PROVIDER_SITE_OTHER): Payer: 59 | Admitting: Neurology

## 2014-10-27 DIAGNOSIS — J309 Allergic rhinitis, unspecified: Secondary | ICD-10-CM | POA: Diagnosis not present

## 2014-11-01 ENCOUNTER — Ambulatory Visit (INDEPENDENT_AMBULATORY_CARE_PROVIDER_SITE_OTHER): Payer: 59

## 2014-11-01 DIAGNOSIS — J309 Allergic rhinitis, unspecified: Secondary | ICD-10-CM

## 2014-11-10 ENCOUNTER — Ambulatory Visit (INDEPENDENT_AMBULATORY_CARE_PROVIDER_SITE_OTHER): Payer: 59 | Admitting: Neurology

## 2014-11-10 DIAGNOSIS — J309 Allergic rhinitis, unspecified: Secondary | ICD-10-CM | POA: Diagnosis not present

## 2014-11-23 ENCOUNTER — Ambulatory Visit (INDEPENDENT_AMBULATORY_CARE_PROVIDER_SITE_OTHER): Payer: 59

## 2014-11-23 DIAGNOSIS — J309 Allergic rhinitis, unspecified: Secondary | ICD-10-CM | POA: Diagnosis not present

## 2014-12-29 ENCOUNTER — Ambulatory Visit (INDEPENDENT_AMBULATORY_CARE_PROVIDER_SITE_OTHER): Payer: 59 | Admitting: *Deleted

## 2014-12-29 DIAGNOSIS — J309 Allergic rhinitis, unspecified: Secondary | ICD-10-CM | POA: Diagnosis not present

## 2015-01-24 ENCOUNTER — Ambulatory Visit (INDEPENDENT_AMBULATORY_CARE_PROVIDER_SITE_OTHER): Payer: 59

## 2015-01-24 DIAGNOSIS — J309 Allergic rhinitis, unspecified: Secondary | ICD-10-CM

## 2015-01-26 DIAGNOSIS — J3089 Other allergic rhinitis: Secondary | ICD-10-CM | POA: Diagnosis not present

## 2015-01-29 DIAGNOSIS — J301 Allergic rhinitis due to pollen: Secondary | ICD-10-CM | POA: Diagnosis not present

## 2015-02-23 ENCOUNTER — Telehealth: Payer: Self-pay | Admitting: Allergy and Immunology

## 2015-02-23 NOTE — Telephone Encounter (Signed)
Called pt back - his injections are his copay $25.54

## 2015-02-23 NOTE — Telephone Encounter (Signed)
Insurance changed on October 1st and he wants to know how much the insurance is covering on injections now with the new insurance.

## 2015-03-06 ENCOUNTER — Ambulatory Visit (INDEPENDENT_AMBULATORY_CARE_PROVIDER_SITE_OTHER): Payer: 59 | Admitting: *Deleted

## 2015-03-06 DIAGNOSIS — J309 Allergic rhinitis, unspecified: Secondary | ICD-10-CM | POA: Diagnosis not present

## 2015-03-26 ENCOUNTER — Ambulatory Visit (INDEPENDENT_AMBULATORY_CARE_PROVIDER_SITE_OTHER): Payer: 59

## 2015-03-26 DIAGNOSIS — J309 Allergic rhinitis, unspecified: Secondary | ICD-10-CM | POA: Diagnosis not present

## 2015-04-27 ENCOUNTER — Ambulatory Visit (INDEPENDENT_AMBULATORY_CARE_PROVIDER_SITE_OTHER): Payer: 59 | Admitting: *Deleted

## 2015-04-27 DIAGNOSIS — J309 Allergic rhinitis, unspecified: Secondary | ICD-10-CM

## 2015-06-01 ENCOUNTER — Ambulatory Visit (INDEPENDENT_AMBULATORY_CARE_PROVIDER_SITE_OTHER): Payer: 59 | Admitting: *Deleted

## 2015-06-01 DIAGNOSIS — J309 Allergic rhinitis, unspecified: Secondary | ICD-10-CM | POA: Diagnosis not present

## 2015-06-22 ENCOUNTER — Ambulatory Visit (INDEPENDENT_AMBULATORY_CARE_PROVIDER_SITE_OTHER): Payer: 59 | Admitting: *Deleted

## 2015-06-22 DIAGNOSIS — J309 Allergic rhinitis, unspecified: Secondary | ICD-10-CM | POA: Diagnosis not present

## 2015-07-05 ENCOUNTER — Ambulatory Visit (INDEPENDENT_AMBULATORY_CARE_PROVIDER_SITE_OTHER): Payer: 59

## 2015-07-05 DIAGNOSIS — J309 Allergic rhinitis, unspecified: Secondary | ICD-10-CM

## 2015-07-25 ENCOUNTER — Ambulatory Visit (INDEPENDENT_AMBULATORY_CARE_PROVIDER_SITE_OTHER): Payer: 59

## 2015-07-25 DIAGNOSIS — J309 Allergic rhinitis, unspecified: Secondary | ICD-10-CM

## 2015-07-30 ENCOUNTER — Ambulatory Visit (INDEPENDENT_AMBULATORY_CARE_PROVIDER_SITE_OTHER): Payer: 59 | Admitting: *Deleted

## 2015-07-30 DIAGNOSIS — J309 Allergic rhinitis, unspecified: Secondary | ICD-10-CM | POA: Diagnosis not present

## 2015-08-23 ENCOUNTER — Ambulatory Visit (INDEPENDENT_AMBULATORY_CARE_PROVIDER_SITE_OTHER): Payer: 59

## 2015-08-23 DIAGNOSIS — J309 Allergic rhinitis, unspecified: Secondary | ICD-10-CM | POA: Diagnosis not present

## 2015-08-31 ENCOUNTER — Ambulatory Visit (INDEPENDENT_AMBULATORY_CARE_PROVIDER_SITE_OTHER): Payer: 59 | Admitting: *Deleted

## 2015-08-31 DIAGNOSIS — J309 Allergic rhinitis, unspecified: Secondary | ICD-10-CM

## 2015-09-03 ENCOUNTER — Ambulatory Visit (INDEPENDENT_AMBULATORY_CARE_PROVIDER_SITE_OTHER): Payer: 59 | Admitting: *Deleted

## 2015-09-03 DIAGNOSIS — J309 Allergic rhinitis, unspecified: Secondary | ICD-10-CM

## 2015-09-14 ENCOUNTER — Ambulatory Visit (INDEPENDENT_AMBULATORY_CARE_PROVIDER_SITE_OTHER): Payer: 59

## 2015-09-14 DIAGNOSIS — J309 Allergic rhinitis, unspecified: Secondary | ICD-10-CM

## 2015-09-17 ENCOUNTER — Ambulatory Visit (INDEPENDENT_AMBULATORY_CARE_PROVIDER_SITE_OTHER): Payer: 59

## 2015-09-17 DIAGNOSIS — J309 Allergic rhinitis, unspecified: Secondary | ICD-10-CM

## 2015-09-18 DIAGNOSIS — J3089 Other allergic rhinitis: Secondary | ICD-10-CM | POA: Diagnosis not present

## 2015-09-19 DIAGNOSIS — J301 Allergic rhinitis due to pollen: Secondary | ICD-10-CM | POA: Diagnosis not present

## 2015-11-05 ENCOUNTER — Ambulatory Visit (INDEPENDENT_AMBULATORY_CARE_PROVIDER_SITE_OTHER): Payer: 59 | Admitting: *Deleted

## 2015-11-05 DIAGNOSIS — J309 Allergic rhinitis, unspecified: Secondary | ICD-10-CM | POA: Diagnosis not present

## 2015-11-27 ENCOUNTER — Ambulatory Visit (INDEPENDENT_AMBULATORY_CARE_PROVIDER_SITE_OTHER): Payer: 59

## 2015-11-27 DIAGNOSIS — J309 Allergic rhinitis, unspecified: Secondary | ICD-10-CM | POA: Diagnosis not present

## 2015-12-26 ENCOUNTER — Ambulatory Visit (INDEPENDENT_AMBULATORY_CARE_PROVIDER_SITE_OTHER): Payer: 59 | Admitting: *Deleted

## 2015-12-26 DIAGNOSIS — J309 Allergic rhinitis, unspecified: Secondary | ICD-10-CM | POA: Diagnosis not present

## 2016-01-22 ENCOUNTER — Ambulatory Visit (INDEPENDENT_AMBULATORY_CARE_PROVIDER_SITE_OTHER): Payer: 59 | Admitting: *Deleted

## 2016-01-22 DIAGNOSIS — J309 Allergic rhinitis, unspecified: Secondary | ICD-10-CM

## 2016-01-30 ENCOUNTER — Ambulatory Visit (INDEPENDENT_AMBULATORY_CARE_PROVIDER_SITE_OTHER): Payer: 59 | Admitting: *Deleted

## 2016-01-30 DIAGNOSIS — J309 Allergic rhinitis, unspecified: Secondary | ICD-10-CM

## 2016-02-20 ENCOUNTER — Ambulatory Visit (INDEPENDENT_AMBULATORY_CARE_PROVIDER_SITE_OTHER): Payer: 59 | Admitting: *Deleted

## 2016-02-20 DIAGNOSIS — J309 Allergic rhinitis, unspecified: Secondary | ICD-10-CM | POA: Diagnosis not present

## 2016-02-25 NOTE — Addendum Note (Signed)
Addended by: Felipa Emory on: 02/25/2016 02:06 PM   Modules accepted: Orders

## 2016-02-26 ENCOUNTER — Ambulatory Visit (INDEPENDENT_AMBULATORY_CARE_PROVIDER_SITE_OTHER): Payer: 59

## 2016-02-26 DIAGNOSIS — J309 Allergic rhinitis, unspecified: Secondary | ICD-10-CM | POA: Diagnosis not present

## 2016-03-04 ENCOUNTER — Ambulatory Visit (INDEPENDENT_AMBULATORY_CARE_PROVIDER_SITE_OTHER): Payer: 59 | Admitting: *Deleted

## 2016-03-04 DIAGNOSIS — J309 Allergic rhinitis, unspecified: Secondary | ICD-10-CM | POA: Diagnosis not present

## 2016-03-13 ENCOUNTER — Ambulatory Visit (INDEPENDENT_AMBULATORY_CARE_PROVIDER_SITE_OTHER): Payer: 59 | Admitting: *Deleted

## 2016-03-13 DIAGNOSIS — J309 Allergic rhinitis, unspecified: Secondary | ICD-10-CM | POA: Diagnosis not present

## 2016-03-24 DIAGNOSIS — D485 Neoplasm of uncertain behavior of skin: Secondary | ICD-10-CM | POA: Diagnosis not present

## 2016-03-24 DIAGNOSIS — L821 Other seborrheic keratosis: Secondary | ICD-10-CM | POA: Diagnosis not present

## 2016-03-24 DIAGNOSIS — L905 Scar conditions and fibrosis of skin: Secondary | ICD-10-CM | POA: Diagnosis not present

## 2016-03-24 DIAGNOSIS — L570 Actinic keratosis: Secondary | ICD-10-CM | POA: Diagnosis not present

## 2016-03-24 DIAGNOSIS — C44519 Basal cell carcinoma of skin of other part of trunk: Secondary | ICD-10-CM | POA: Diagnosis not present

## 2016-03-24 DIAGNOSIS — L111 Transient acantholytic dermatosis [Grover]: Secondary | ICD-10-CM | POA: Diagnosis not present

## 2016-03-24 DIAGNOSIS — Z85828 Personal history of other malignant neoplasm of skin: Secondary | ICD-10-CM | POA: Diagnosis not present

## 2016-04-01 ENCOUNTER — Ambulatory Visit (INDEPENDENT_AMBULATORY_CARE_PROVIDER_SITE_OTHER): Payer: 59 | Admitting: *Deleted

## 2016-04-01 DIAGNOSIS — J309 Allergic rhinitis, unspecified: Secondary | ICD-10-CM | POA: Diagnosis not present

## 2016-04-09 DIAGNOSIS — C44519 Basal cell carcinoma of skin of other part of trunk: Secondary | ICD-10-CM | POA: Diagnosis not present

## 2016-04-22 ENCOUNTER — Ambulatory Visit (INDEPENDENT_AMBULATORY_CARE_PROVIDER_SITE_OTHER): Payer: 59 | Admitting: *Deleted

## 2016-04-22 DIAGNOSIS — J309 Allergic rhinitis, unspecified: Secondary | ICD-10-CM | POA: Diagnosis not present

## 2016-05-13 ENCOUNTER — Ambulatory Visit (INDEPENDENT_AMBULATORY_CARE_PROVIDER_SITE_OTHER): Payer: 59 | Admitting: Allergy and Immunology

## 2016-05-13 ENCOUNTER — Encounter: Payer: Self-pay | Admitting: Allergy and Immunology

## 2016-05-13 VITALS — BP 124/88 | HR 64 | Resp 16 | Ht 74.33 in | Wt 206.4 lb

## 2016-05-13 DIAGNOSIS — H1045 Other chronic allergic conjunctivitis: Secondary | ICD-10-CM

## 2016-05-13 DIAGNOSIS — J3089 Other allergic rhinitis: Secondary | ICD-10-CM

## 2016-05-13 DIAGNOSIS — H1010 Acute atopic conjunctivitis, unspecified eye: Secondary | ICD-10-CM

## 2016-05-13 MED ORDER — METHYLPREDNISOLONE ACETATE 80 MG/ML IJ SUSP
80.0000 mg | Freq: Once | INTRAMUSCULAR | Status: AC
  Administered 2016-05-13: 80 mg via INTRAMUSCULAR

## 2016-05-13 MED ORDER — FLUTICASONE PROPIONATE 50 MCG/ACT NA SUSP: 1.0000 | Freq: Every day | NASAL | 5 refills | Status: DC

## 2016-05-13 MED ORDER — OLOPATADINE HCL 0.7 % OP SOLN: 1.0000 [drp] | Freq: Every day | OPHTHALMIC | 5 refills | Status: DC

## 2016-05-13 MED ORDER — EPINEPHRINE 0.3 MG/0.3ML IJ SOAJ: 0.3000 mg | Freq: Once | INTRAMUSCULAR | 3 refills | Status: DC | PRN

## 2016-05-13 MED ORDER — MONTELUKAST SODIUM 10 MG PO TABS: 10.0000 mg | ORAL_TABLET | Freq: Every day | ORAL | 5 refills | Status: DC

## 2016-05-13 NOTE — Progress Notes (Signed)
Follow-up Note  Referring Provider: Burnard Bunting, MD Primary Provider: Geoffery Lyons, MD Date of Office Visit: 05/13/2016  Subjective:   Dalton Lopez (DOB: 26-Nov-1960) is a 56 y.o. male who returns to the [facility] on 05/13/2016 in re-evaluation of the following:  HPI: Dalton Lopez returns to this clinic in reevaluation of his allergic rhinoconjunctivitis treated with immunotherapy. It is been over 2 years since I have seen him in this clinic.  His immunotherapy is going quite well. He is presently using immunotherapy every 3 weeks. He can have exposure to his dog now with no problem whatsoever.  However, he still has springtime and fall time flareups of his eye and nose issue with nasal congestion and sneezing and itchy red watery eyes even in the face of using Claritin and Singulair and Flonase. He thought that he was doing better with his immunotherapy regarding his spring and fall control until the past year or so.  Allergies as of 05/13/2016   No Known Allergies     Medication List      EPIPEN 2-PAK 0.3 mg/0.3 mL Soaj injection Generic drug:  EPINEPHrine Inject 0.3 mg into the muscle Once PRN.   fexofenadine 180 MG tablet Commonly known as:  ALLEGRA Take 180 mg by mouth daily.   Fish Oil 1200 MG Caps Take 1,200 mg by mouth daily.   fluticasone 50 MCG/ACT nasal spray Commonly known as:  FLONASE Place 1 spray into both nostrils daily.   meloxicam 15 MG tablet Commonly known as:  MOBIC Take 15 mg by mouth 2 (two) times daily.   montelukast 10 MG tablet Commonly known as:  SINGULAIR Take 10 mg by mouth.   multivitamin with minerals Tabs tablet Take 1 tablet by mouth daily.   PARoxetine 20 MG tablet Commonly known as:  PAXIL Take 20 mg by mouth daily.   SUDAFED PO Take by mouth.   TYLENOL PO Take by mouth.       Past Medical History:  Diagnosis Date  . Actinic keratosis   . Allergy    seasonal  . Anxiety   . Arthritis    bil knees, back  . History of kidney stones   . History of moderate sun exposure   . Skin cancer    basal cell , squamous cell    Past Surgical History:  Procedure Laterality Date  . ACHILLES TENDON REPAIR     right   . FOOT SURGERY  2011   joint fusion repair with screws left foot  . knee atthrosopic  11/2011   Bil  . MOHS SURGERY     multiple  . SKIN BIOPSY  11/17/13   forehead, left temple, central nose tip    Review of systems negative except as noted in HPI / PMHx or noted below:  Review of Systems  Constitutional: Negative.   HENT: Negative.   Eyes: Negative.   Respiratory: Negative.   Cardiovascular: Negative.   Gastrointestinal: Negative.   Genitourinary: Negative.   Musculoskeletal: Negative.   Skin: Negative.   Neurological: Negative.   Endo/Heme/Allergies: Negative.   Psychiatric/Behavioral: Negative.      Objective:   Vitals:   05/13/16 1532  BP: 124/88  Pulse: 64  Resp: 16   Height: 6' 2.33" (188.8 cm)  Weight: 206 lb 6.4 oz (93.6 kg)   Physical Exam  Constitutional: He is well-developed, well-nourished, and in no distress.  HENT:  Head: Normocephalic.  Right Ear: Tympanic membrane, external ear and ear  canal normal.  Left Ear: Tympanic membrane, external ear and ear canal normal.  Nose: Mucosal edema present. No rhinorrhea.  Mouth/Throat: Uvula is midline, oropharynx is clear and moist and mucous membranes are normal. No oropharyngeal exudate.  Eyes: Right conjunctiva is injected. Left conjunctiva is injected.  Neck: Trachea normal. No tracheal tenderness present. No tracheal deviation present. No thyromegaly present.  Cardiovascular: Normal rate, regular rhythm, S1 normal, S2 normal and normal heart sounds.   No murmur heard. Pulmonary/Chest: Breath sounds normal. No stridor. No respiratory distress. He has no wheezes. He has no rales.  Musculoskeletal: He exhibits no edema.  Lymphadenopathy:       Head (right side): No tonsillar  adenopathy present.       Head (left side): No tonsillar adenopathy present.    He has no cervical adenopathy.  Neurological: He is alert. Gait normal.  Skin: No rash noted. He is not diaphoretic. No erythema. Nails show no clubbing.  Psychiatric: Mood and affect normal.    Diagnostics: none   Assessment and Plan:   1. Other allergic rhinitis   2. Seasonal allergic conjunctivitis     1. Continue immunotherapy for now  2. Arrange for skin testing without antihistamines when spring flare is over  3. Depo-Medrol 80 IM delivered in clinic  4. Continue a combination of the following:   A. Flonase 1-2 sprays each nostril one time per day  B. montelukast 10 mg daily  5. If needed:   A. OTC antihistamine - Zyrtec/Claritin/Allegra 1-2 times a day  B. Pazeo 1 drop each eye one time per day. Coupon  6. Further evaluation and treatment?  I think we need to determine if Dalton Lopez has developed different types of allergies that are not covered by his immunotherapy and we will arrange for skin testing once the springtime season is over. I did give him a systemic steroid today to help with his rather significant springtime flare and he will continue to use anti-inflammatory agents for his respiratory tract as noted above. Further evaluation and treatment will be based upon his response and results of his repeat skin testing.  Allena Katz, MD Allergy / Immunology [place]

## 2016-05-13 NOTE — Patient Instructions (Addendum)
  1. Continue immunotherapy for now  2. Arrange for skin testing without antihistamines when spring flare is over  3. Depo-Medrol 80 IM delivered in clinic  4. Continue a combination of the following:   A. Flonase 1-2 sprays each nostril one time per day  B. montelukast 10 mg daily  5. If needed:   A. OTC antihistamine - Zyrtec/Claritin/Allegra 1-2 times a day  B. Pazeo 1 drop each eye one time per day. Coupon  6. Further evaluation and treatment?

## 2016-05-15 ENCOUNTER — Telehealth: Payer: Self-pay | Admitting: *Deleted

## 2016-05-15 MED ORDER — OLOPATADINE HCL 0.1 % OP SOLN: 1.0000 [drp] | Freq: Two times a day (BID) | OPHTHALMIC | 3 refills | Status: DC

## 2016-05-15 NOTE — Telephone Encounter (Signed)
Patanol.   

## 2016-05-15 NOTE — Telephone Encounter (Signed)
Patanol sent to pharmacy. Left message to advise pt.

## 2016-05-15 NOTE — Telephone Encounter (Signed)
Could you please advise what dose to send in

## 2016-05-15 NOTE — Telephone Encounter (Signed)
Pharmacy called [facility] is not covered. Insurance prefers generic optivar and patanol please advise

## 2016-05-15 NOTE — Telephone Encounter (Signed)
One drop each eye two times a day

## 2016-05-28 ENCOUNTER — Encounter: Payer: Self-pay | Admitting: *Deleted

## 2016-05-30 DIAGNOSIS — J3089 Other allergic rhinitis: Secondary | ICD-10-CM | POA: Diagnosis not present

## 2016-06-12 ENCOUNTER — Ambulatory Visit (INDEPENDENT_AMBULATORY_CARE_PROVIDER_SITE_OTHER): Payer: 59 | Admitting: *Deleted

## 2016-06-12 DIAGNOSIS — J309 Allergic rhinitis, unspecified: Secondary | ICD-10-CM | POA: Diagnosis not present

## 2016-06-17 ENCOUNTER — Ambulatory Visit (INDEPENDENT_AMBULATORY_CARE_PROVIDER_SITE_OTHER): Payer: 59

## 2016-06-17 DIAGNOSIS — J309 Allergic rhinitis, unspecified: Secondary | ICD-10-CM

## 2016-07-01 ENCOUNTER — Ambulatory Visit (INDEPENDENT_AMBULATORY_CARE_PROVIDER_SITE_OTHER): Payer: 59 | Admitting: *Deleted

## 2016-07-01 DIAGNOSIS — J309 Allergic rhinitis, unspecified: Secondary | ICD-10-CM | POA: Diagnosis not present

## 2016-07-07 DIAGNOSIS — L910 Hypertrophic scar: Secondary | ICD-10-CM | POA: Diagnosis not present

## 2016-08-06 ENCOUNTER — Ambulatory Visit (INDEPENDENT_AMBULATORY_CARE_PROVIDER_SITE_OTHER): Payer: 59

## 2016-08-06 DIAGNOSIS — J309 Allergic rhinitis, unspecified: Secondary | ICD-10-CM | POA: Diagnosis not present

## 2016-08-25 ENCOUNTER — Ambulatory Visit (INDEPENDENT_AMBULATORY_CARE_PROVIDER_SITE_OTHER): Payer: 59

## 2016-08-25 DIAGNOSIS — J309 Allergic rhinitis, unspecified: Secondary | ICD-10-CM | POA: Diagnosis not present

## 2016-09-02 ENCOUNTER — Ambulatory Visit (INDEPENDENT_AMBULATORY_CARE_PROVIDER_SITE_OTHER): Payer: 59 | Admitting: *Deleted

## 2016-09-02 DIAGNOSIS — J309 Allergic rhinitis, unspecified: Secondary | ICD-10-CM

## 2016-09-09 ENCOUNTER — Ambulatory Visit (INDEPENDENT_AMBULATORY_CARE_PROVIDER_SITE_OTHER): Payer: 59 | Admitting: *Deleted

## 2016-09-09 DIAGNOSIS — J309 Allergic rhinitis, unspecified: Secondary | ICD-10-CM

## 2016-09-17 ENCOUNTER — Ambulatory Visit (INDEPENDENT_AMBULATORY_CARE_PROVIDER_SITE_OTHER): Payer: 59

## 2016-09-17 DIAGNOSIS — J309 Allergic rhinitis, unspecified: Secondary | ICD-10-CM

## 2016-09-24 DIAGNOSIS — D0461 Carcinoma in situ of skin of right upper limb, including shoulder: Secondary | ICD-10-CM | POA: Diagnosis not present

## 2016-09-24 DIAGNOSIS — Z85828 Personal history of other malignant neoplasm of skin: Secondary | ICD-10-CM | POA: Diagnosis not present

## 2016-09-24 DIAGNOSIS — L910 Hypertrophic scar: Secondary | ICD-10-CM | POA: Diagnosis not present

## 2016-09-24 DIAGNOSIS — L814 Other melanin hyperpigmentation: Secondary | ICD-10-CM | POA: Diagnosis not present

## 2016-09-24 DIAGNOSIS — C44619 Basal cell carcinoma of skin of left upper limb, including shoulder: Secondary | ICD-10-CM | POA: Diagnosis not present

## 2016-09-25 ENCOUNTER — Ambulatory Visit (INDEPENDENT_AMBULATORY_CARE_PROVIDER_SITE_OTHER): Payer: 59 | Admitting: *Deleted

## 2016-09-25 DIAGNOSIS — J309 Allergic rhinitis, unspecified: Secondary | ICD-10-CM

## 2016-10-15 DIAGNOSIS — Z0000 Encounter for general adult medical examination without abnormal findings: Secondary | ICD-10-CM | POA: Diagnosis not present

## 2016-10-20 ENCOUNTER — Ambulatory Visit (INDEPENDENT_AMBULATORY_CARE_PROVIDER_SITE_OTHER): Payer: 59 | Admitting: *Deleted

## 2016-10-20 DIAGNOSIS — J309 Allergic rhinitis, unspecified: Secondary | ICD-10-CM | POA: Diagnosis not present

## 2016-10-22 DIAGNOSIS — H9190 Unspecified hearing loss, unspecified ear: Secondary | ICD-10-CM | POA: Diagnosis not present

## 2016-10-22 DIAGNOSIS — M5380 Other specified dorsopathies, site unspecified: Secondary | ICD-10-CM | POA: Diagnosis not present

## 2016-10-22 DIAGNOSIS — M1990 Unspecified osteoarthritis, unspecified site: Secondary | ICD-10-CM | POA: Diagnosis not present

## 2016-10-22 DIAGNOSIS — Z0000 Encounter for general adult medical examination without abnormal findings: Secondary | ICD-10-CM | POA: Diagnosis not present

## 2016-10-22 DIAGNOSIS — Z1389 Encounter for screening for other disorder: Secondary | ICD-10-CM | POA: Diagnosis not present

## 2016-10-24 DIAGNOSIS — J302 Other seasonal allergic rhinitis: Secondary | ICD-10-CM | POA: Diagnosis not present

## 2016-10-31 DIAGNOSIS — Z1212 Encounter for screening for malignant neoplasm of rectum: Secondary | ICD-10-CM | POA: Diagnosis not present

## 2016-11-12 DIAGNOSIS — H903 Sensorineural hearing loss, bilateral: Secondary | ICD-10-CM | POA: Diagnosis not present

## 2016-11-24 ENCOUNTER — Ambulatory Visit (INDEPENDENT_AMBULATORY_CARE_PROVIDER_SITE_OTHER): Payer: 59 | Admitting: *Deleted

## 2016-11-24 DIAGNOSIS — J309 Allergic rhinitis, unspecified: Secondary | ICD-10-CM

## 2016-12-22 ENCOUNTER — Ambulatory Visit (INDEPENDENT_AMBULATORY_CARE_PROVIDER_SITE_OTHER): Payer: 59 | Admitting: *Deleted

## 2016-12-22 DIAGNOSIS — J309 Allergic rhinitis, unspecified: Secondary | ICD-10-CM | POA: Diagnosis not present

## 2017-01-27 ENCOUNTER — Ambulatory Visit (INDEPENDENT_AMBULATORY_CARE_PROVIDER_SITE_OTHER): Payer: 59 | Admitting: *Deleted

## 2017-01-27 DIAGNOSIS — J309 Allergic rhinitis, unspecified: Secondary | ICD-10-CM | POA: Diagnosis not present

## 2017-01-29 DIAGNOSIS — L821 Other seborrheic keratosis: Secondary | ICD-10-CM | POA: Diagnosis not present

## 2017-01-29 DIAGNOSIS — C44519 Basal cell carcinoma of skin of other part of trunk: Secondary | ICD-10-CM | POA: Diagnosis not present

## 2017-01-29 DIAGNOSIS — L570 Actinic keratosis: Secondary | ICD-10-CM | POA: Diagnosis not present

## 2017-01-29 DIAGNOSIS — Z85828 Personal history of other malignant neoplasm of skin: Secondary | ICD-10-CM | POA: Diagnosis not present

## 2017-02-11 DIAGNOSIS — J3089 Other allergic rhinitis: Secondary | ICD-10-CM | POA: Diagnosis not present

## 2017-03-03 ENCOUNTER — Ambulatory Visit: Payer: 59 | Admitting: Allergy and Immunology

## 2017-03-18 ENCOUNTER — Ambulatory Visit (INDEPENDENT_AMBULATORY_CARE_PROVIDER_SITE_OTHER): Payer: 59

## 2017-03-18 DIAGNOSIS — J309 Allergic rhinitis, unspecified: Secondary | ICD-10-CM

## 2017-03-26 ENCOUNTER — Ambulatory Visit: Payer: 59 | Admitting: Family Medicine

## 2017-03-26 ENCOUNTER — Encounter: Payer: Self-pay | Admitting: Family Medicine

## 2017-03-26 VITALS — BP 122/84 | HR 70 | Resp 17

## 2017-03-26 DIAGNOSIS — J302 Other seasonal allergic rhinitis: Secondary | ICD-10-CM

## 2017-03-26 DIAGNOSIS — R0789 Other chest pain: Secondary | ICD-10-CM

## 2017-03-26 DIAGNOSIS — J3089 Other allergic rhinitis: Secondary | ICD-10-CM

## 2017-03-26 NOTE — Progress Notes (Signed)
[address] [place] [address] Dept: [phone]  FOLLOW UP NOTE  Patient ID: Dalton Lopez, male    DOB: 03/21/1960  Age: 57 y.o. MRN: [id number] Date of Office Visit: 03/26/2017  Assessment  Chief Complaint: Allergy Testing  HPI Dalton Lopez is a 57 year old male who presents to the clinic for allergy skin testing today.  He was last seen in this clinic on 05/13/2016 by Dr. Carmelina Peal for evaluation of allergic rhinoconjunctivitis treated with immunotherapy.  At that time he reported he still had spring and fall flareups of nasal congestion, sneezing, and red itchy watery eyes.  At today's visit, he reports that he has been on immunotherapy for several years, however, he continues to experience spring and fall flareups with his nasal and eye symptoms.  He is in the clinic today for skin testing to evaluate the composition of his immunotherapy.  He reports taking Claritin 10 mg daily and Nasonex nasal spray and montelukast 10 mg on an as-needed basis.  He reports area on bilateral dorsal side of his hand where his skin gets red and itchy every year for the last 4 years when the weather gets cold.  He is currently using daily moisturizing routine and he reports using a steroid cream as needed, however, this is not listed in his medication list and he cannot recall the name of the medicine.  He denies any open areas or drainage from this skin irritation.  Skipper reports over the last couple of years feeling chest heaviness, chest tickle, and chest tightness for 2 or 3 days when the weather turns from warm to cold or cold to warm.  He denies shortness of breath, wheeze, and cough.  He has never used an inhaler.  His current medications are listed in the chart.   Drug Allergies:  No Known Allergies  Physical Exam: BP 122/84 (BP Location: Left Arm, Patient Position: Sitting)   Pulse 70   Resp 17   SpO2 95%    Physical Exam  Constitutional: He is oriented to person, place, and  time. He appears well-developed and well-nourished.  HENT:  Right Ear: External ear normal.  Left Ear: External ear normal.  Mouth/Throat: Oropharynx is clear and moist.  Bilateral nares slightly erythematous and edematous with clear nasal drainage noted.  Pharynx normal.  Ears normal.  Eyes: Conjunctivae are normal.  Neck: Normal range of motion. Neck supple.  Cardiovascular: Normal rate, regular rhythm and normal heart sounds.  S1-S2 normal.  No murmur noted.  Regular heart rate and rhythm.  Pulmonary/Chest: Effort normal and breath sounds normal.  Lungs clear to auscultation  Musculoskeletal: Normal range of motion.  Neurological: He is alert and oriented to person, place, and time.  Skin: Skin is warm and dry.  Dry red areas on the dorsal side of bilateral thumbs.  Psychiatric: He has a normal mood and affect. His behavior is normal.    Diagnostics: FVC 4.55, FEV1 4.14.  Predicted FVC 5.65, predicted FEV1 4.33.  Spirometry reveals normal ventilatory function.  Post bronchodilator therapy FVC 4.89, FEV1 4.43.  No significant bronchodilator response.  Assessment and Plan: 1. Seasonal and perennial allergic rhinitis   2. Chest tightness     No orders of the defined types were placed in this encounter.   Patient Instructions  Your skin testing today was negative including the positive control. This is likely an effect of Claritin or pseudoephedrine taken earlier in the week. We will postpone the skin testing portion of  the visit until next week Wednesday. Please do not take any Claritin or pseudoephedrine until after skin testing on Wednesday, March 13.   After skin testing on Wednesday: Continue a combination of the following:   A. Flonase 1-2 sprays each nostril one time per day  Begin montelukast 10 mg daily   If needed:   A. OTC antihistamine - Zyrtec/Claritin/Allegra 1-2 times a day  B. Pazeo 1 drop each eye one time per day.  Your breathing test today did not  indicate asthma  Follow up on March 13 for skin testing for updated allergens   Return in about 6 days (around 04/01/2017), or if symptoms worsen or fail to improve.    Thank you for the opportunity to care for this patient.  Please do not hesitate to contact me with questions.  Gareth Morgan, FNP Allergy and [facility]

## 2017-03-26 NOTE — Patient Instructions (Addendum)
Your skin testing today was negative including the positive control. This is likely an effect of Claritin or pseudoephedrine taken earlier in the week. We will postpone the skin testing portion of the visit until next week Wednesday. Please do not take any Claritin or pseudoephedrine until after skin testing on Wednesday, March 13.   After skin testing on Wednesday: Continue a combination of the following:   A. Flonase 1-2 sprays each nostril one time per day  Begin montelukast 10 mg daily   If needed:   A. OTC antihistamine - Zyrtec/Claritin/Allegra 1-2 times a day  B. Pazeo 1 drop each eye one time per day.  Your breathing test today did not indicate asthma  Follow up on March 13 for skin testing for updated allergens

## 2017-04-02 ENCOUNTER — Ambulatory Visit (INDEPENDENT_AMBULATORY_CARE_PROVIDER_SITE_OTHER): Payer: 59 | Admitting: Family Medicine

## 2017-04-02 ENCOUNTER — Encounter: Payer: Self-pay | Admitting: Family Medicine

## 2017-04-02 VITALS — BP 118/78 | HR 64 | Resp 18

## 2017-04-02 DIAGNOSIS — H1010 Acute atopic conjunctivitis, unspecified eye: Secondary | ICD-10-CM

## 2017-04-02 DIAGNOSIS — J3089 Other allergic rhinitis: Secondary | ICD-10-CM | POA: Diagnosis not present

## 2017-04-02 DIAGNOSIS — J302 Other seasonal allergic rhinitis: Secondary | ICD-10-CM

## 2017-04-02 NOTE — Progress Notes (Signed)
[address] [place] [address] Dept: [phone]  FOLLOW UP NOTE  Patient ID: Dalton Lopez, male    DOB: October 02, 1960  Age: 57 y.o. MRN: [id number] Date of Office Visit: 04/02/2017  Assessment  Chief Complaint: Allergy Testing and Allergic Rhinitis  (Itchy Eyes and Runny nose.)  HPI Dalton Lopez is a 57 year old male who presents to the clinic today for follow-up skin testing.  He was last seen in this clinic on 03/26/2017 by Gareth Morgan NP to update allergy skin testing.  At that visit, he reported he had been on immunotherapy for several years, however he continued to experience flareups with nasal and eye symptoms occurring in the spring and fall.  At that visit, his skin test was entirely nonreactive including positive control.  At today's visit, he reports he is feeling well and has not taken any antihistamines in the last 10 days, however, he has been taking Sudafed several times a day.  He reports increased sneezing, nasal drainage, and itchy watery eyes. He denies shortness of breath, wheeze, and cough.  Drug Allergies:  No Known Allergies  Physical Exam: BP 118/78 (BP Location: Left Arm, Patient Position: Sitting, Cuff Size: Normal)   Pulse 64   Resp 18   SpO2 98%    Physical Exam  Constitutional: He is oriented to person, place, and time. He appears well-developed and well-nourished.  HENT:  Head: Normocephalic and atraumatic.  Right Ear: External ear normal.  Left Ear: External ear normal.  Mouth/Throat: Oropharynx is clear and moist.  Bilateral nares erythematous and edematous with clear nasal drainage noted.  Pharynx clear.  Eyes normal.  Ears normal.  Eyes: Conjunctivae are normal.  Neck: Normal range of motion. Neck supple.  Cardiovascular: Normal rate, regular rhythm and normal heart sounds.  S1-S2 normal.  Regular heart rate and rhythm.  No murmur noted.  Pulmonary/Chest: Effort normal and breath sounds normal.  Lungs clear to auscultation    Musculoskeletal: Normal range of motion.  Neurological: He is alert and oriented to person, place, and time.  Skin: Skin is warm and dry.  Psychiatric: He has a normal mood and affect. His behavior is normal.    Diagnostics: Percutaneous skin prick testing positive for each American, pine mix, Alternaria alternata, and mouse.  Intradermal testing was positive for [place] grass, Johnson grass, tree mix, major mold mix to, major mold mix 3, major mold mix for, cat hair, dog epithelia, and might mix.   Assessment and Plan: 1. Seasonal and perennial allergic rhinitis   2. Seasonal allergic conjunctivitis     No orders of the defined types were placed in this encounter.   Patient Instructions  Your skin testing today was positive for grass pollen, tree pollen, dust mites, cat, dog, and seasonal and perennial molds.  Continue a combination of the following:   A. Flonase 1-2 sprays each nostril one time per day as needed for a stuffy nose  B. Add Astelin nasal spray 2 sprays in each nostril twice a day as needed for nasal symptoms  C. Begin montelukast 10 mg daily  D. Allergen avoidance measures have been provided.   E. I will call you with the new allergen vial information.   If needed:   A. OTC antihistamine - Zyrtec/Claritin/Allegra 1-2 times a day  B. Pazeo 1 drop each eye one time per day.     Return in about 2 weeks (around 04/16/2017), or if symptoms worsen or fail to improve.  Thank you for the opportunity to care for this patient.  Please do not hesitate to contact me with questions.  Gareth Morgan, FNP Allergy and [facility]

## 2017-04-02 NOTE — Patient Instructions (Signed)
Your skin testing today was positive for grass pollen, tree pollen, dust mites, cat, dog, and seasonal and perennial molds.  Continue a combination of the following:   A. Flonase 1-2 sprays each nostril one time per day as needed for a stuffy nose  B. Add Astelin nasal spray 2 sprays in each nostril twice a day as needed for nasal symptoms  C. Begin montelukast 10 mg daily  D. Allergen avoidance measures have been provided.   E. I will call you with the new allergen vial information.   If needed:   A. OTC antihistamine - Zyrtec/Claritin/Allegra 1-2 times a day  B. Pazeo 1 drop each eye one time per day.

## 2017-04-03 NOTE — Addendum Note (Signed)
Addended by: Dara Hoyer on: 04/03/2017 04:24 PM   Modules accepted: Orders

## 2017-04-06 NOTE — Addendum Note (Signed)
Addended by: Dara Hoyer on: 04/06/2017 07:23 AM   Modules accepted: Orders

## 2017-04-07 ENCOUNTER — Telehealth: Payer: Self-pay | Admitting: *Deleted

## 2017-04-07 NOTE — Telephone Encounter (Signed)
3rd vial ordered.

## 2017-04-07 NOTE — Telephone Encounter (Signed)
-----   Message from Dara Hoyer, FNP sent at 04/06/2017  7:16 AM EDT ----- Can you please call this patient and let him know that we are adding a third vial with only molds. He will receive a shot once or twice a week from his new vial and stay on his previous schedule with the other vials, once every 3 weeks. He will need to make an appointment for the first shot of his new vial (molds) in about 2 weeks. Thank you

## 2017-04-24 NOTE — Addendum Note (Signed)
Addended by: Jiles Prows on: 04/24/2017 03:36 PM   Modules accepted: Orders

## 2017-04-29 ENCOUNTER — Encounter: Payer: Self-pay | Admitting: *Deleted

## 2017-04-29 DIAGNOSIS — J3089 Other allergic rhinitis: Secondary | ICD-10-CM | POA: Diagnosis not present

## 2017-04-29 NOTE — Progress Notes (Signed)
4 VAIL SET MADE. EXP: 04-30-18. HV

## 2017-05-27 ENCOUNTER — Encounter: Payer: Self-pay | Admitting: *Deleted

## 2017-05-28 ENCOUNTER — Ambulatory Visit (INDEPENDENT_AMBULATORY_CARE_PROVIDER_SITE_OTHER): Payer: 59 | Admitting: *Deleted

## 2017-05-28 DIAGNOSIS — C44612 Basal cell carcinoma of skin of right upper limb, including shoulder: Secondary | ICD-10-CM | POA: Diagnosis not present

## 2017-05-28 DIAGNOSIS — J309 Allergic rhinitis, unspecified: Secondary | ICD-10-CM | POA: Diagnosis not present

## 2017-05-28 DIAGNOSIS — L820 Inflamed seborrheic keratosis: Secondary | ICD-10-CM | POA: Diagnosis not present

## 2017-05-28 DIAGNOSIS — D485 Neoplasm of uncertain behavior of skin: Secondary | ICD-10-CM | POA: Diagnosis not present

## 2017-05-28 DIAGNOSIS — L570 Actinic keratosis: Secondary | ICD-10-CM | POA: Diagnosis not present

## 2017-05-28 DIAGNOSIS — Z85828 Personal history of other malignant neoplasm of skin: Secondary | ICD-10-CM | POA: Diagnosis not present

## 2017-05-28 DIAGNOSIS — C4441 Basal cell carcinoma of skin of scalp and neck: Secondary | ICD-10-CM | POA: Diagnosis not present

## 2017-05-28 NOTE — Progress Notes (Signed)
Immunotherapy   Patient Details  Name: Dalton Lopez MRN: [id number] Date of Birth: 09-08-1960  05/28/2017  Dalton Lopez started injections for  Mold Following schedule: B  Frequency:1 time per week Epi-Pen:Epi-Pen Available  Consent signed and patient instructions given.   Orlene Erm 05/28/2017, 6:40 PM

## 2017-05-28 NOTE — Progress Notes (Signed)
This encounter was created in error - please disregard.

## 2017-06-09 ENCOUNTER — Ambulatory Visit (INDEPENDENT_AMBULATORY_CARE_PROVIDER_SITE_OTHER): Payer: 59 | Admitting: *Deleted

## 2017-06-09 DIAGNOSIS — J309 Allergic rhinitis, unspecified: Secondary | ICD-10-CM | POA: Diagnosis not present

## 2017-06-11 ENCOUNTER — Ambulatory Visit (INDEPENDENT_AMBULATORY_CARE_PROVIDER_SITE_OTHER): Payer: 59

## 2017-06-11 DIAGNOSIS — J309 Allergic rhinitis, unspecified: Secondary | ICD-10-CM | POA: Diagnosis not present

## 2017-06-18 ENCOUNTER — Ambulatory Visit (INDEPENDENT_AMBULATORY_CARE_PROVIDER_SITE_OTHER): Payer: 59 | Admitting: *Deleted

## 2017-06-18 DIAGNOSIS — J309 Allergic rhinitis, unspecified: Secondary | ICD-10-CM

## 2017-06-23 ENCOUNTER — Ambulatory Visit (INDEPENDENT_AMBULATORY_CARE_PROVIDER_SITE_OTHER): Payer: 59 | Admitting: *Deleted

## 2017-06-23 DIAGNOSIS — J309 Allergic rhinitis, unspecified: Secondary | ICD-10-CM

## 2017-06-30 DIAGNOSIS — Z85828 Personal history of other malignant neoplasm of skin: Secondary | ICD-10-CM | POA: Diagnosis not present

## 2017-06-30 DIAGNOSIS — C4441 Basal cell carcinoma of skin of scalp and neck: Secondary | ICD-10-CM | POA: Diagnosis not present

## 2017-07-01 ENCOUNTER — Ambulatory Visit (INDEPENDENT_AMBULATORY_CARE_PROVIDER_SITE_OTHER): Payer: 59

## 2017-07-01 DIAGNOSIS — J309 Allergic rhinitis, unspecified: Secondary | ICD-10-CM | POA: Diagnosis not present

## 2017-07-07 DIAGNOSIS — R3912 Poor urinary stream: Secondary | ICD-10-CM | POA: Diagnosis not present

## 2017-07-07 DIAGNOSIS — N401 Enlarged prostate with lower urinary tract symptoms: Secondary | ICD-10-CM | POA: Diagnosis not present

## 2017-07-07 DIAGNOSIS — N200 Calculus of kidney: Secondary | ICD-10-CM | POA: Diagnosis not present

## 2017-07-08 ENCOUNTER — Ambulatory Visit (INDEPENDENT_AMBULATORY_CARE_PROVIDER_SITE_OTHER): Payer: 59 | Admitting: *Deleted

## 2017-07-08 DIAGNOSIS — M542 Cervicalgia: Secondary | ICD-10-CM | POA: Diagnosis not present

## 2017-07-08 DIAGNOSIS — M5412 Radiculopathy, cervical region: Secondary | ICD-10-CM | POA: Diagnosis not present

## 2017-07-08 DIAGNOSIS — J309 Allergic rhinitis, unspecified: Secondary | ICD-10-CM

## 2017-07-08 DIAGNOSIS — M5030 Other cervical disc degeneration, unspecified cervical region: Secondary | ICD-10-CM | POA: Diagnosis not present

## 2017-07-16 ENCOUNTER — Ambulatory Visit (INDEPENDENT_AMBULATORY_CARE_PROVIDER_SITE_OTHER): Payer: 59

## 2017-07-16 DIAGNOSIS — J309 Allergic rhinitis, unspecified: Secondary | ICD-10-CM | POA: Diagnosis not present

## 2017-07-17 NOTE — Progress Notes (Signed)
Patient came in 07/16/2017 and received his Mold injection and due to miscommunication I did not give him his Tree-Grass nor his Dmite-C-W-D. Called patient to inform him and patient stated he would come in 07/17/2017 to receive those two injections.

## 2017-07-29 ENCOUNTER — Other Ambulatory Visit: Payer: Self-pay | Admitting: Orthopedic Surgery

## 2017-07-29 DIAGNOSIS — M545 Low back pain: Secondary | ICD-10-CM | POA: Diagnosis not present

## 2017-07-29 DIAGNOSIS — M9905 Segmental and somatic dysfunction of pelvic region: Secondary | ICD-10-CM | POA: Diagnosis not present

## 2017-07-29 DIAGNOSIS — M9903 Segmental and somatic dysfunction of lumbar region: Secondary | ICD-10-CM | POA: Diagnosis not present

## 2017-08-05 ENCOUNTER — Ambulatory Visit (INDEPENDENT_AMBULATORY_CARE_PROVIDER_SITE_OTHER): Payer: 59

## 2017-08-05 DIAGNOSIS — J309 Allergic rhinitis, unspecified: Secondary | ICD-10-CM | POA: Diagnosis not present

## 2017-08-10 DIAGNOSIS — M50123 Cervical disc disorder at C6-C7 level with radiculopathy: Secondary | ICD-10-CM | POA: Diagnosis not present

## 2017-08-10 DIAGNOSIS — M9902 Segmental and somatic dysfunction of thoracic region: Secondary | ICD-10-CM | POA: Diagnosis not present

## 2017-08-10 DIAGNOSIS — M9903 Segmental and somatic dysfunction of lumbar region: Secondary | ICD-10-CM | POA: Diagnosis not present

## 2017-08-18 ENCOUNTER — Ambulatory Visit (INDEPENDENT_AMBULATORY_CARE_PROVIDER_SITE_OTHER): Payer: 59 | Admitting: *Deleted

## 2017-08-18 DIAGNOSIS — J309 Allergic rhinitis, unspecified: Secondary | ICD-10-CM

## 2017-08-19 DIAGNOSIS — M9902 Segmental and somatic dysfunction of thoracic region: Secondary | ICD-10-CM | POA: Diagnosis not present

## 2017-08-19 DIAGNOSIS — M9903 Segmental and somatic dysfunction of lumbar region: Secondary | ICD-10-CM | POA: Diagnosis not present

## 2017-08-19 DIAGNOSIS — M50123 Cervical disc disorder at C6-C7 level with radiculopathy: Secondary | ICD-10-CM | POA: Diagnosis not present

## 2017-08-25 ENCOUNTER — Telehealth: Payer: Self-pay | Admitting: *Deleted

## 2017-08-25 ENCOUNTER — Ambulatory Visit (INDEPENDENT_AMBULATORY_CARE_PROVIDER_SITE_OTHER): Payer: 59 | Admitting: *Deleted

## 2017-08-25 DIAGNOSIS — J309 Allergic rhinitis, unspecified: Secondary | ICD-10-CM | POA: Diagnosis not present

## 2017-08-25 DIAGNOSIS — M9902 Segmental and somatic dysfunction of thoracic region: Secondary | ICD-10-CM | POA: Diagnosis not present

## 2017-08-25 DIAGNOSIS — M9903 Segmental and somatic dysfunction of lumbar region: Secondary | ICD-10-CM | POA: Diagnosis not present

## 2017-08-25 DIAGNOSIS — M50123 Cervical disc disorder at C6-C7 level with radiculopathy: Secondary | ICD-10-CM | POA: Diagnosis not present

## 2017-08-25 NOTE — Telephone Encounter (Signed)
Patient would like to go back to 2 injections once he has completed the build up for the Molds. Please advise and provide new scripts.

## 2017-09-02 DIAGNOSIS — M9902 Segmental and somatic dysfunction of thoracic region: Secondary | ICD-10-CM | POA: Diagnosis not present

## 2017-09-02 DIAGNOSIS — M50123 Cervical disc disorder at C6-C7 level with radiculopathy: Secondary | ICD-10-CM | POA: Diagnosis not present

## 2017-09-02 DIAGNOSIS — M9903 Segmental and somatic dysfunction of lumbar region: Secondary | ICD-10-CM | POA: Diagnosis not present

## 2017-09-03 ENCOUNTER — Ambulatory Visit (INDEPENDENT_AMBULATORY_CARE_PROVIDER_SITE_OTHER): Payer: 59 | Admitting: *Deleted

## 2017-09-03 DIAGNOSIS — J309 Allergic rhinitis, unspecified: Secondary | ICD-10-CM

## 2017-09-08 ENCOUNTER — Ambulatory Visit (INDEPENDENT_AMBULATORY_CARE_PROVIDER_SITE_OTHER): Payer: 59 | Admitting: *Deleted

## 2017-09-08 DIAGNOSIS — J309 Allergic rhinitis, unspecified: Secondary | ICD-10-CM

## 2017-09-10 ENCOUNTER — Ambulatory Visit (INDEPENDENT_AMBULATORY_CARE_PROVIDER_SITE_OTHER): Payer: 59 | Admitting: *Deleted

## 2017-09-10 DIAGNOSIS — J309 Allergic rhinitis, unspecified: Secondary | ICD-10-CM | POA: Diagnosis not present

## 2017-09-15 ENCOUNTER — Ambulatory Visit (INDEPENDENT_AMBULATORY_CARE_PROVIDER_SITE_OTHER): Payer: 59 | Admitting: *Deleted

## 2017-09-15 DIAGNOSIS — J309 Allergic rhinitis, unspecified: Secondary | ICD-10-CM

## 2017-09-17 NOTE — Telephone Encounter (Signed)
Please let me know when build up is complete.

## 2017-09-17 NOTE — Telephone Encounter (Signed)
FYI

## 2017-09-25 ENCOUNTER — Ambulatory Visit (INDEPENDENT_AMBULATORY_CARE_PROVIDER_SITE_OTHER): Payer: 59

## 2017-09-25 DIAGNOSIS — J309 Allergic rhinitis, unspecified: Secondary | ICD-10-CM | POA: Diagnosis not present

## 2017-09-29 ENCOUNTER — Ambulatory Visit (INDEPENDENT_AMBULATORY_CARE_PROVIDER_SITE_OTHER): Payer: 59 | Admitting: *Deleted

## 2017-09-29 DIAGNOSIS — J309 Allergic rhinitis, unspecified: Secondary | ICD-10-CM | POA: Diagnosis not present

## 2017-10-02 ENCOUNTER — Ambulatory Visit (INDEPENDENT_AMBULATORY_CARE_PROVIDER_SITE_OTHER): Payer: 59

## 2017-10-02 DIAGNOSIS — J309 Allergic rhinitis, unspecified: Secondary | ICD-10-CM | POA: Diagnosis not present

## 2017-10-06 ENCOUNTER — Ambulatory Visit (INDEPENDENT_AMBULATORY_CARE_PROVIDER_SITE_OTHER): Payer: 59 | Admitting: *Deleted

## 2017-10-06 DIAGNOSIS — J309 Allergic rhinitis, unspecified: Secondary | ICD-10-CM

## 2017-10-08 ENCOUNTER — Ambulatory Visit (INDEPENDENT_AMBULATORY_CARE_PROVIDER_SITE_OTHER): Payer: 59 | Admitting: *Deleted

## 2017-10-08 DIAGNOSIS — J309 Allergic rhinitis, unspecified: Secondary | ICD-10-CM | POA: Diagnosis not present

## 2017-10-12 ENCOUNTER — Ambulatory Visit (INDEPENDENT_AMBULATORY_CARE_PROVIDER_SITE_OTHER): Payer: 59

## 2017-10-12 DIAGNOSIS — J309 Allergic rhinitis, unspecified: Secondary | ICD-10-CM

## 2017-10-20 ENCOUNTER — Ambulatory Visit (INDEPENDENT_AMBULATORY_CARE_PROVIDER_SITE_OTHER): Payer: 59 | Admitting: *Deleted

## 2017-10-20 DIAGNOSIS — J309 Allergic rhinitis, unspecified: Secondary | ICD-10-CM | POA: Diagnosis not present

## 2017-10-21 DIAGNOSIS — R82998 Other abnormal findings in urine: Secondary | ICD-10-CM | POA: Diagnosis not present

## 2017-10-21 DIAGNOSIS — Z0000 Encounter for general adult medical examination without abnormal findings: Secondary | ICD-10-CM | POA: Diagnosis not present

## 2017-10-26 ENCOUNTER — Ambulatory Visit (INDEPENDENT_AMBULATORY_CARE_PROVIDER_SITE_OTHER): Payer: 59

## 2017-10-26 DIAGNOSIS — J309 Allergic rhinitis, unspecified: Secondary | ICD-10-CM

## 2017-10-28 DIAGNOSIS — Z1389 Encounter for screening for other disorder: Secondary | ICD-10-CM | POA: Diagnosis not present

## 2017-10-28 DIAGNOSIS — E041 Nontoxic single thyroid nodule: Secondary | ICD-10-CM | POA: Diagnosis not present

## 2017-10-28 DIAGNOSIS — Z23 Encounter for immunization: Secondary | ICD-10-CM | POA: Diagnosis not present

## 2017-10-28 DIAGNOSIS — Z0000 Encounter for general adult medical examination without abnormal findings: Secondary | ICD-10-CM | POA: Diagnosis not present

## 2017-10-28 DIAGNOSIS — M5380 Other specified dorsopathies, site unspecified: Secondary | ICD-10-CM | POA: Diagnosis not present

## 2017-10-28 DIAGNOSIS — M1990 Unspecified osteoarthritis, unspecified site: Secondary | ICD-10-CM | POA: Diagnosis not present

## 2017-11-03 ENCOUNTER — Ambulatory Visit: Payer: 59 | Admitting: Allergy & Immunology

## 2017-11-03 ENCOUNTER — Encounter: Payer: Self-pay | Admitting: Allergy & Immunology

## 2017-11-03 VITALS — BP 98/68 | HR 82 | Temp 97.9°F | Resp 20 | Ht 74.7 in | Wt 202.4 lb

## 2017-11-03 DIAGNOSIS — J3089 Other allergic rhinitis: Secondary | ICD-10-CM | POA: Diagnosis not present

## 2017-11-03 DIAGNOSIS — J309 Allergic rhinitis, unspecified: Secondary | ICD-10-CM

## 2017-11-03 DIAGNOSIS — Z1212 Encounter for screening for malignant neoplasm of rectum: Secondary | ICD-10-CM | POA: Diagnosis not present

## 2017-11-03 DIAGNOSIS — J302 Other seasonal allergic rhinitis: Secondary | ICD-10-CM | POA: Diagnosis not present

## 2017-11-03 MED ORDER — METHYLPREDNISOLONE ACETATE 80 MG/ML IJ SUSP
80.0000 mg | Freq: Once | INTRAMUSCULAR | Status: AC
  Administered 2017-11-03: 80 mg via INTRAMUSCULAR

## 2017-11-03 NOTE — Progress Notes (Signed)
FOLLOW UP  Date of Service/Encounter:  11/03/17   Assessment:   Seasonal and perennial allergic rhinitis (grasses, weeds, ragweed, trees, cat, dog, molds, dust mites) - on allergen immunotherapy for 20+ years   Mr. Dalton Lopez is having an exacerbation of his allergic rhinitis, likely secondary to ragweed.  He is using all of his medications and has maximized these.  Therefore, we will give him a Depo-Medrol injection to help him get through this current season.  He requires a Depo-Medrol injection less than once per year.   Plan/Recommendations:   1.  Perennial and seasonal allergic rhinitis - DepoMedrol 80mg  given in clinic today. - Continue with Flonase 1-2 sprays each nostril one time per day as needed. - Continue with Astelin nasal spray 2 sprays in each nostril twice a day as needed. - Continue with Singulair 10mg  daily. - Continue with antihistamines 1-2 times daily as needed. - Continue with Pazeo one drop per eye daily as needed.  - Continue with allergy shots at the same schedule.  2. Eczema - Samples of Eucrisa provided to use twice daily as needed to the worst areas. Dalton Lopez is not a steroid, therefore it avoids the side effects of steroids.   3. Return in about 6 months (around 05/05/2018).  Subjective:   Dalton Lopez is a 57 y.o. male presenting today for follow up of  Chief Complaint  Patient presents with  . Pruritis    eye, nose    Dalton Lopez has a history of the following: Patient Active Problem List   Diagnosis Date Noted  . Seasonal and perennial allergic rhinitis 03/26/2017  . Chest tightness 03/26/2017  . Seasonal allergic conjunctivitis 09/30/2014  . Basal cell carcinoma of left nasal tip 12/20/2013    History obtained from: chart review and patient.  Dalton Lopez's Primary Care Provider is Burnard Bunting, MD.     Dalton Lopez is a 57 y.o. male presenting for a sick visit.  He was last seen in March 2019.  At that time, it was decided to add a  third vial containing molds to his regimen.  He was on Flonase and Astelin was added.  He was also started on Singulair 10 mg daily.  He was encouraged to take Zyrtec, Claritin, or Allegra 1-2 times daily.  Since the last visit, he has mostly done well. He is having a tough time now with the change in the weather. He is having trouble with nasal and ocular symptoms that have kept him away. His last DepoMedrol was in April 2018.   Overall he has a hard time saying whether his allergy shots are helping. He was much worse as a child but he is now taking more medications than he was a child. He was previously followed at [facility] but the shots at that time did not seem to help, which is why he started seeing [place] around 20 years ago.   He does have a history of eczema treated with hydrocortisone ointment. He sees Dr. Pablo Ledger at [facility]. He works in a Human resources officer as Engineer, structural. These are chemicals for cleaning chicken houses and food processing plants. He is not exposed to the chemicals directly at all.    Otherwise, there have been no changes to his past medical history, surgical history, family history, or social history.    Review of Systems: a 14-point review of systems is pertinent for what is mentioned in HPI.  Otherwise, all other systems were negative.  Constitutional:  negative other than that listed in the HPI Eyes: negative other than that listed in the HPI Ears, nose, mouth, throat, and face: negative other than that listed in the HPI Respiratory: negative other than that listed in the HPI Cardiovascular: negative other than that listed in the HPI Gastrointestinal: negative other than that listed in the HPI Genitourinary: negative other than that listed in the HPI Integument: negative other than that listed in the HPI Hematologic: negative other than that listed in the HPI Musculoskeletal: negative other than that listed in the HPI Neurological: negative other than that  listed in the HPI Allergy/Immunologic: negative other than that listed in the HPI    Objective:   Blood pressure 98/68, pulse 82, temperature 97.9 F (36.6 C), temperature source Oral, resp. rate 20, height 6' 2.7" (1.897 m), weight 202 lb 6.4 oz (91.8 kg), SpO2 96 %. Body mass index is 25.5 kg/m.   Physical Exam:  General: Alert, interactive, in no acute distress. Pleasant male. Very talkative.  Eyes: No conjunctival injection bilaterally, no discharge on the right, no discharge on the left and no Horner-Trantas dots present. PERRL bilaterally. EOMI without pain. No photophobia.  Ears: Right TM pearly gray with normal light reflex, Left TM pearly gray with normal light reflex, Right TM intact without perforation and Left TM intact without perforation.  Nose/Throat: External nose within normal limits and septum midline. Turbinates edematous with clear discharge. Posterior oropharynx erythematous without cobblestoning in the posterior oropharynx. Tonsils 2+ without exudates.  Tongue without thrush. Lungs: Clear to auscultation without wheezing, rhonchi or rales. No increased work of breathing. CV: Normal S1/S2. No murmurs. Capillary refill <2 seconds.   Skin: Warm and dry, without lesions or rashes. Neuro:   Grossly intact. No focal deficits appreciated. Responsive to questions.  Diagnostic studies: none     Salvatore Marvel, MD  Allergy and [facility]

## 2017-11-03 NOTE — Patient Instructions (Addendum)
1.  Perennial and seasonal allergic rhinitis - DepoMedrol 80mg  given in clinic today. - Continue with Flonase 1-2 sprays each nostril one time per day as needed. - Continue with Astelin nasal spray 2 sprays in each nostril twice a day as needed. - Continue with Singulair 10mg  daily. - Continue with antihistamines 1-2 times daily as needed. - Continue with Pazeo one drop per eye daily as needed.  - Continue with allergy shots at the same schedule.  2. Eczema - Samples of Eucrisa provided to use twice daily as needed to the worst areas. [name] is not a steroid, therefore it avoids the side effects of steroids.   3. Return in about 6 months (around 05/05/2018).   Please inform us of any Emergency Department visits, hospitalizations, or changes in symptoms. Call us before going to the ED for breathing or allergy symptoms since we might be able to fit you in for a sick visit. Feel free to contact us anytime with any questions, problems, or concerns.  It was a pleasure to meet you today!  Websites that have reliable patient information: 1. American Academy of Asthma, Allergy, and Immunology: www.aaaai.org 2. Food Allergy Research and Education (FARE): foodallergy.org 3. Mothers of Asthmatics: http://www.asthmacommunitynetwork.org 4. American College of Allergy, Asthma, and Immunology: MonthlyElectricBill.co.uk   Make sure you are registered to vote! If you have moved or changed any of your contact information, you will need to get this updated before voting!

## 2017-11-26 ENCOUNTER — Ambulatory Visit (INDEPENDENT_AMBULATORY_CARE_PROVIDER_SITE_OTHER): Payer: 59 | Admitting: *Deleted

## 2017-11-26 DIAGNOSIS — J309 Allergic rhinitis, unspecified: Secondary | ICD-10-CM

## 2017-11-26 NOTE — Progress Notes (Signed)
VIALS EXP 11-27-18.  COMBINED PER DR K DIRECTIONS.

## 2017-11-27 DIAGNOSIS — J3089 Other allergic rhinitis: Secondary | ICD-10-CM | POA: Diagnosis not present

## 2017-11-30 ENCOUNTER — Ambulatory Visit (INDEPENDENT_AMBULATORY_CARE_PROVIDER_SITE_OTHER): Payer: 59 | Admitting: *Deleted

## 2017-11-30 DIAGNOSIS — J309 Allergic rhinitis, unspecified: Secondary | ICD-10-CM

## 2017-12-16 ENCOUNTER — Ambulatory Visit (INDEPENDENT_AMBULATORY_CARE_PROVIDER_SITE_OTHER): Payer: 59 | Admitting: *Deleted

## 2017-12-16 DIAGNOSIS — J309 Allergic rhinitis, unspecified: Secondary | ICD-10-CM | POA: Diagnosis not present

## 2018-01-01 ENCOUNTER — Ambulatory Visit (INDEPENDENT_AMBULATORY_CARE_PROVIDER_SITE_OTHER): Payer: 59 | Admitting: *Deleted

## 2018-01-01 DIAGNOSIS — J309 Allergic rhinitis, unspecified: Secondary | ICD-10-CM

## 2018-01-04 ENCOUNTER — Ambulatory Visit (INDEPENDENT_AMBULATORY_CARE_PROVIDER_SITE_OTHER): Payer: 59 | Admitting: *Deleted

## 2018-01-04 DIAGNOSIS — J309 Allergic rhinitis, unspecified: Secondary | ICD-10-CM | POA: Diagnosis not present

## 2018-01-04 NOTE — Addendum Note (Signed)
Addended by: Felipa Emory on: 01/04/2018 11:41 AM   Modules accepted: Orders

## 2018-01-04 NOTE — Addendum Note (Signed)
Addended by: Felipa Emory on: 01/04/2018 11:13 AM   Modules accepted: Orders

## 2018-01-04 NOTE — Addendum Note (Signed)
Addended by: Felipa Emory on: 01/04/2018 11:12 AM   Modules accepted: Orders

## 2018-01-11 ENCOUNTER — Ambulatory Visit (INDEPENDENT_AMBULATORY_CARE_PROVIDER_SITE_OTHER): Payer: 59

## 2018-01-11 DIAGNOSIS — J309 Allergic rhinitis, unspecified: Secondary | ICD-10-CM | POA: Diagnosis not present

## 2018-01-21 ENCOUNTER — Ambulatory Visit (INDEPENDENT_AMBULATORY_CARE_PROVIDER_SITE_OTHER): Payer: 59 | Admitting: *Deleted

## 2018-01-21 DIAGNOSIS — J309 Allergic rhinitis, unspecified: Secondary | ICD-10-CM

## 2018-01-26 ENCOUNTER — Telehealth: Payer: Self-pay | Admitting: *Deleted

## 2018-01-26 NOTE — Telephone Encounter (Signed)
Lets go with every 3 weeks at 0.50

## 2018-01-26 NOTE — Telephone Encounter (Signed)
Mr. Hellmer has now started his vials that are combined into in 2 vials with Mold added to Tree-Grass vial and then DMite-C-W-D in other vial.  This will be his second Red vial for Mold.  He has been coming every 3-4 weeks once at 0.50 for his Tree-Grass when it was without Mold.  Do you want him dropped back with new vials to every 2 weeks since Mold is added or is it okay to continue with every 3 weeks once at 0.50?  Thank you,  [employer]

## 2018-01-26 NOTE — Telephone Encounter (Signed)
Noted in Immunotherapy flow sheet.

## 2018-02-01 DIAGNOSIS — D2261 Melanocytic nevi of right upper limb, including shoulder: Secondary | ICD-10-CM | POA: Diagnosis not present

## 2018-02-01 DIAGNOSIS — Z85828 Personal history of other malignant neoplasm of skin: Secondary | ICD-10-CM | POA: Diagnosis not present

## 2018-02-01 DIAGNOSIS — L821 Other seborrheic keratosis: Secondary | ICD-10-CM | POA: Diagnosis not present

## 2018-02-01 DIAGNOSIS — L570 Actinic keratosis: Secondary | ICD-10-CM | POA: Diagnosis not present

## 2018-02-01 DIAGNOSIS — C44319 Basal cell carcinoma of skin of other parts of face: Secondary | ICD-10-CM | POA: Diagnosis not present

## 2018-02-05 ENCOUNTER — Ambulatory Visit (INDEPENDENT_AMBULATORY_CARE_PROVIDER_SITE_OTHER): Payer: 59 | Admitting: *Deleted

## 2018-02-05 DIAGNOSIS — J309 Allergic rhinitis, unspecified: Secondary | ICD-10-CM | POA: Diagnosis not present

## 2018-02-11 ENCOUNTER — Encounter: Payer: Self-pay | Admitting: Internal Medicine

## 2018-02-16 ENCOUNTER — Ambulatory Visit (INDEPENDENT_AMBULATORY_CARE_PROVIDER_SITE_OTHER): Payer: 59 | Admitting: *Deleted

## 2018-02-16 DIAGNOSIS — J309 Allergic rhinitis, unspecified: Secondary | ICD-10-CM | POA: Diagnosis not present

## 2018-02-22 ENCOUNTER — Ambulatory Visit (INDEPENDENT_AMBULATORY_CARE_PROVIDER_SITE_OTHER): Payer: 59 | Admitting: *Deleted

## 2018-02-22 DIAGNOSIS — J309 Allergic rhinitis, unspecified: Secondary | ICD-10-CM

## 2018-02-26 DIAGNOSIS — M7061 Trochanteric bursitis, right hip: Secondary | ICD-10-CM | POA: Diagnosis not present

## 2018-02-26 DIAGNOSIS — M25551 Pain in right hip: Secondary | ICD-10-CM | POA: Diagnosis not present

## 2018-03-01 ENCOUNTER — Ambulatory Visit (INDEPENDENT_AMBULATORY_CARE_PROVIDER_SITE_OTHER): Payer: 59 | Admitting: *Deleted

## 2018-03-01 DIAGNOSIS — J309 Allergic rhinitis, unspecified: Secondary | ICD-10-CM | POA: Diagnosis not present

## 2018-03-09 ENCOUNTER — Encounter: Payer: Self-pay | Admitting: Internal Medicine

## 2018-03-09 ENCOUNTER — Ambulatory Visit (INDEPENDENT_AMBULATORY_CARE_PROVIDER_SITE_OTHER): Payer: 59 | Admitting: Internal Medicine

## 2018-03-09 VITALS — BP 118/80 | HR 72 | Ht 74.0 in | Wt 204.0 lb

## 2018-03-09 DIAGNOSIS — K5900 Constipation, unspecified: Secondary | ICD-10-CM

## 2018-03-09 DIAGNOSIS — R194 Change in bowel habit: Secondary | ICD-10-CM | POA: Diagnosis not present

## 2018-03-09 MED ORDER — NA SULFATE-K SULFATE-MG SULF 17.5-3.13-1.6 GM/177ML PO SOLN: 1.0000 | Freq: Once | ORAL | 0 refills | Status: AC

## 2018-03-09 NOTE — Progress Notes (Signed)
HISTORY OF PRESENT ILLNESS:  Dalton Lopez is a 58 y.o. male, owner of [employer], who is self-referred regarding abrupt change in bowel habits.  I saw the patient on one occasion December 10, 2012 when he underwent screening colonoscopy.  Examination was normal.  Follow-up in 10 years recommended.  Patient tells me that he had regular daily bowel movements until approximately 3 or 4 months ago.  He noticed change in stool caliber, becoming more narrow.  Also significant constipation with difficulty defecating.  He has always taken a daily fiber supplement.  With the onset of constipation he increased his water consumption.  He continues with difficulties.  No bleeding or weight loss.  He denies a change in his activity level, diet, medications, or supplements.  No interval family history of colon cancer.  GI review of systems is otherwise negative.  There is some bloating discomfort with constipation.  Review of outside records shows office evaluation with Dr. Reynaldo Minium January 2020.  He did undergo Hemoccult testing which was negative.  The only abdominal x-ray was from June 2015 which revealed right-sided renal calculus.  REVIEW OF SYSTEMS:  All non-GI ROS negative unless otherwise stated in the HPI except for sinus and allergy trouble, anxiety, arthritis  Past Medical History:  Diagnosis Date  . Actinic keratosis   . Allergy    seasonal  . Anxiety   . Arthritis    bil knees, back  . DDD (degenerative disc disease), lumbar   . History of kidney stones   . History of moderate sun exposure   . Skin cancer    basal cell , squamous cell  . Thyroid nodule     Past Surgical History:  Procedure Laterality Date  . ACHILLES TENDON REPAIR     right   . FOOT SURGERY Left 2011   joint fusion repair with screws left foot  . KNEE ARTHROSCOPY Bilateral 11/2011   Bil  . MEDIAL PARTIAL KNEE REPLACEMENT Left   . MOHS SURGERY     multiple  . SKIN BIOPSY  11/17/13   forehead,  left temple, central nose tip    Social History Dalton Lopez  reports that he has never smoked. He has never used smokeless tobacco. He reports current alcohol use. He reports that he does not use drugs.  family history includes Allergic rhinitis in his daughter and father; Asthma in his father; Ovarian cancer in his paternal grandmother; Skin cancer in his father and paternal grandmother.  No Known Allergies     PHYSICAL EXAMINATION: Vital signs: BP 118/80   Pulse 72   Ht 6\' 2"  (1.88 m)   Wt 204 lb (92.5 kg)   BMI 26.19 kg/m   Constitutional: generally well-appearing, no acute distress Psychiatric: alert and oriented x3, cooperative Eyes: extraocular movements intact, anicteric, conjunctiva pink Mouth: oral pharynx moist, no lesions Neck: supple no lymphadenopathy Cardiovascular: heart regular rate and rhythm, no murmur Lungs: clear to auscultation bilaterally Abdomen: soft, nontender, nondistended, no obvious ascites, no peritoneal signs, normal bowel sounds, no organomegaly Rectal: Deferred to colonoscopy Extremities: no clubbing, cyanosis, or lower extremity edema bilaterally Skin: no lesions on visible extremities Neuro: No focal deficits.  Cranial nerves intact  ASSESSMENT:  1.  Healthy 58 year old with abrupt change in bowel habits as manifested by narrowed stool caliber and constipation associated with abdominal bloating discomfort.  No obvious precipitant.  Most concerning thing would be interval development of neoplasia.  This could be functional constipation.  Regardless, the  problem needs further evaluated 2.  Screening colonoscopy December 10, 2012 was normal   PLAN:  1.  Colonoscopy.The nature of the procedure, as well as the risks, benefits, and alternatives were carefully and thoroughly reviewed with the patient. Ample time for discussion and questions allowed. The patient understood, was satisfied, and agreed to proceed. 2.  Recommend daily MiraLAX at this  time.  The recommendation may be adjusted post colonoscopy if necessary

## 2018-03-09 NOTE — Patient Instructions (Signed)

## 2018-04-06 ENCOUNTER — Ambulatory Visit (INDEPENDENT_AMBULATORY_CARE_PROVIDER_SITE_OTHER): Payer: 59 | Admitting: *Deleted

## 2018-04-06 DIAGNOSIS — J309 Allergic rhinitis, unspecified: Secondary | ICD-10-CM

## 2018-04-23 ENCOUNTER — Encounter: Payer: 59 | Admitting: Internal Medicine

## 2018-04-28 ENCOUNTER — Encounter: Payer: 59 | Admitting: Internal Medicine

## 2018-05-03 ENCOUNTER — Ambulatory Visit (INDEPENDENT_AMBULATORY_CARE_PROVIDER_SITE_OTHER): Payer: 59

## 2018-05-03 DIAGNOSIS — J309 Allergic rhinitis, unspecified: Secondary | ICD-10-CM | POA: Diagnosis not present

## 2018-05-04 ENCOUNTER — Other Ambulatory Visit: Payer: Self-pay

## 2018-05-04 ENCOUNTER — Encounter: Payer: Self-pay | Admitting: Allergy and Immunology

## 2018-05-04 ENCOUNTER — Ambulatory Visit: Payer: 59 | Admitting: Allergy and Immunology

## 2018-05-04 VITALS — BP 120/78 | HR 60 | Temp 98.1°F | Resp 16

## 2018-05-04 DIAGNOSIS — J302 Other seasonal allergic rhinitis: Secondary | ICD-10-CM | POA: Diagnosis not present

## 2018-05-04 DIAGNOSIS — J3089 Other allergic rhinitis: Secondary | ICD-10-CM | POA: Diagnosis not present

## 2018-05-04 MED ORDER — FLUTICASONE PROPIONATE 50 MCG/ACT NA SUSP: 1.0000 | Freq: Every day | NASAL | 5 refills | Status: DC

## 2018-05-04 MED ORDER — MONTELUKAST SODIUM 10 MG PO TABS: 10.0000 mg | ORAL_TABLET | Freq: Every day | ORAL | 5 refills | Status: DC

## 2018-05-04 MED ORDER — METHYLPREDNISOLONE ACETATE 80 MG/ML IJ SUSP
80.0000 mg | Freq: Once | INTRAMUSCULAR | Status: AC
  Administered 2018-05-04: 11:00:00 80 mg via INTRAMUSCULAR

## 2018-05-04 MED ORDER — CRISABOROLE 2 % EX OINT: 1.0000 "application " | TOPICAL_OINTMENT | Freq: Two times a day (BID) | CUTANEOUS | 3 refills | Status: DC | PRN

## 2018-05-04 NOTE — Patient Instructions (Addendum)
   1. Continue a combination of the following:   A. Flonase 1-2 sprays each nostril one time per day    B. montelukast 10 mg daily  2. Continue immunotherapy  3. Depomedrol 80 IM delivered in clinic today   4.  If needed:   A. OTC antihistamine - Zyrtec/Claritin/Allegra 1-2 times a day  B. Pataday 1 drop each eye two times per day.  5. Return to clinic in 1 year or earlier if problem

## 2018-05-04 NOTE — Progress Notes (Signed)
Dalton Lopez   Follow-up Note  Referring Provider: Burnard Bunting, MD Primary Provider: Burnard Bunting, MD Date of Office Visit: 05/04/2018  Subjective:   Dalton Lopez (DOB: 1960-07-04) is a 58 y.o. male who returns to the [place] on 05/04/2018 in re-evaluation of the following:  HPI: Dalton Lopez returns to this clinic in reevaluation of his allergic rhinoconjunctivitis treated with immunotherapy.  He was last seen in this clinic by Dr. Ernst Lopez on 03 November 2017.  Overall he is really doing well since his last visit but unfortunately over the course of the past 2 or 3 weeks since the pollen counts have increased he has had lots of nasal congestion sneezing and some itchy eyes.  He is requesting his usual springtime Depo-Medrol injection.  His symptoms continue even in the face of consistently utilizing a nasal steroid and a leukotriene modifier.  As noted above prior to this point in time he has really done well and has not required a systemic steroid or antibiotic to treat any type of airway issue.  He can now have exposure to his dog without much problem.  Immunotherapy is every 4 weeks at this point in time without any adverse effect.  Allergies as of 05/04/2018   No Known Allergies     Medication List      EPINEPHrine 0.3 mg/0.3 mL Soaj injection Commonly known as:  EpiPen 2-Pak Inject 0.3 mLs (0.3 mg total) into the muscle Once PRN.   fexofenadine 180 MG tablet Commonly known as:  ALLEGRA Take 180 mg by mouth daily.   Fish Oil 1200 MG Caps Take 1,200 mg by mouth daily.   fluticasone 50 MCG/ACT nasal spray Commonly known as:  FLONASE Place 1-2 sprays into both nostrils daily.   meloxicam 15 MG tablet Commonly known as:  MOBIC Take 15 mg by mouth 2 (two) times daily.   montelukast 10 MG tablet Commonly known as:  SINGULAIR Take 1 tablet (10 mg total) by mouth at bedtime.   multivitamin with minerals  Tabs tablet Take 1 tablet by mouth daily.   olopatadine 0.1 % ophthalmic solution Commonly known as:  Patanol Place 1 drop into the right eye 2 (two) times daily.   PARoxetine 20 MG tablet Commonly known as:  PAXIL Take 20 mg by mouth daily.   potassium citrate 10 MEQ (1080 MG) SR tablet Commonly known as:  UROCIT-K potassium citrate ER 10 mEq (1,080 mg) tablet,extended release   SUDAFED PO Take by mouth.   TYLENOL PO Take by mouth.       Past Medical History:  Diagnosis Date   Actinic keratosis    Allergy    seasonal   Anxiety    Arthritis    bil knees, back   DDD (degenerative disc disease), lumbar    History of kidney stones    History of moderate sun exposure    Skin cancer    basal cell , squamous cell   Thyroid nodule     Past Surgical History:  Procedure Laterality Date   ACHILLES TENDON REPAIR     right    FOOT SURGERY Left 2011   joint fusion repair with screws left foot   KNEE ARTHROSCOPY Bilateral 11/2011   Bil   MEDIAL PARTIAL KNEE REPLACEMENT Left    MOHS SURGERY     multiple   SKIN BIOPSY  11/17/13   forehead, left temple, central nose tip    Review  of systems negative except as noted in HPI / PMHx or noted below:  Review of Systems  Constitutional: Negative.   HENT: Negative.   Eyes: Negative.   Respiratory: Negative.   Cardiovascular: Negative.   Gastrointestinal: Negative.   Genitourinary: Negative.   Musculoskeletal: Negative.   Skin: Negative.   Neurological: Negative.   Endo/Heme/Allergies: Negative.   Psychiatric/Behavioral: Negative.      Objective:   Vitals:   05/04/18 1101  BP: 120/78  Pulse: 60  Resp: 16  Temp: 98.1 F (36.7 C)  SpO2: 97%          Physical Exam Constitutional:      Appearance: He is not diaphoretic.  HENT:     Head: Normocephalic.     Right Ear: Tympanic membrane, ear canal and external ear normal.     Left Ear: Tympanic membrane, ear canal and external ear normal.      Nose: Mucosal edema present. No rhinorrhea.     Mouth/Throat:     Pharynx: Uvula midline. No oropharyngeal exudate.  Eyes:     Conjunctiva/sclera:     Right eye: Right conjunctiva is injected.     Left eye: Left conjunctiva is injected.  Neck:     Thyroid: No thyromegaly.     Trachea: Trachea normal. No tracheal tenderness or tracheal deviation.  Cardiovascular:     Rate and Rhythm: Normal rate and regular rhythm.     Heart sounds: Normal heart sounds, S1 normal and S2 normal. No murmur.  Pulmonary:     Effort: No respiratory distress.     Breath sounds: Normal breath sounds. No stridor. No wheezing or rales.  Lymphadenopathy:     Head:     Right side of head: No tonsillar adenopathy.     Left side of head: No tonsillar adenopathy.     Cervical: No cervical adenopathy.  Skin:    Findings: No erythema or rash.     Nails: There is no clubbing.   Neurological:     Mental Status: He is alert.     Diagnostics: none  Assessment and Plan:   1. Seasonal and perennial allergic rhinitis      1. Continue a combination of the following:   A. Flonase 1-2 sprays each nostril one time per day    B. montelukast 10 mg daily  2. Continue immunotherapy  3. Depomedrol 80 IM delivered in clinic today   4.  If needed:   A. OTC antihistamine - Zyrtec/Claritin/Allegra 1-2 times a day  B. Pataday 1 drop each eye two times per day.  5. Return to clinic in 1 year or earlier if problem  Dalton Lopez appears to have significant problems with conjunctival and upper airway inflammation secondary to pollen exposure and we will give him a systemic steroid as noted above.  He will continue to utilize immunotherapy and other anti-inflammatory agents for his airway including a nasal steroid and a leukotriene modifier and I will see him back in this clinic in 1 year or earlier if there is a problem.  Dalton Katz, MD Allergy / Immunology [place]

## 2018-05-05 ENCOUNTER — Encounter: Payer: Self-pay | Admitting: Allergy and Immunology

## 2018-05-06 ENCOUNTER — Ambulatory Visit: Payer: 59 | Admitting: Allergy & Immunology

## 2018-06-03 ENCOUNTER — Ambulatory Visit (INDEPENDENT_AMBULATORY_CARE_PROVIDER_SITE_OTHER): Payer: 59

## 2018-06-03 DIAGNOSIS — J309 Allergic rhinitis, unspecified: Secondary | ICD-10-CM

## 2018-07-05 DIAGNOSIS — J3089 Other allergic rhinitis: Secondary | ICD-10-CM | POA: Diagnosis not present

## 2018-07-05 NOTE — Progress Notes (Signed)
Vials exp 07-05-2019

## 2018-07-08 ENCOUNTER — Ambulatory Visit (INDEPENDENT_AMBULATORY_CARE_PROVIDER_SITE_OTHER): Payer: 59

## 2018-07-08 DIAGNOSIS — J309 Allergic rhinitis, unspecified: Secondary | ICD-10-CM

## 2018-07-20 ENCOUNTER — Encounter: Payer: Self-pay | Admitting: Internal Medicine

## 2018-07-29 ENCOUNTER — Encounter: Payer: Self-pay | Admitting: Internal Medicine

## 2018-07-29 ENCOUNTER — Other Ambulatory Visit: Payer: Self-pay

## 2018-07-29 ENCOUNTER — Ambulatory Visit (AMBULATORY_SURGERY_CENTER): Payer: Self-pay

## 2018-07-29 VITALS — Ht 74.0 in | Wt 200.0 lb

## 2018-07-29 DIAGNOSIS — R194 Change in bowel habit: Secondary | ICD-10-CM

## 2018-07-29 DIAGNOSIS — K5900 Constipation, unspecified: Secondary | ICD-10-CM

## 2018-07-29 MED ORDER — SUPREP BOWEL PREP KIT 17.5-3.13-1.6 GM/177ML PO SOLN: 1.0000 | Freq: Once | ORAL | 0 refills | Status: AC

## 2018-07-29 NOTE — Progress Notes (Signed)
Per pt, no allergies to soy or egg products.Pt not taking any weight loss meds or using  O2 at home. Pt states he does have post -op nausea and vomiting with some sedation.  Pt refused emmi video.  The PV was done over the phone due to COVID-19. Verified pt's insurance and address. Reviewed medical hx and prep instructions with the pt and will mail paperwork to the pt today. Informed pt to call with any questions or changes prior to his procedure. Pt understood.

## 2018-08-12 ENCOUNTER — Telehealth: Payer: Self-pay | Admitting: Internal Medicine

## 2018-08-12 NOTE — Telephone Encounter (Signed)

## 2018-08-13 ENCOUNTER — Encounter: Payer: Self-pay | Admitting: Internal Medicine

## 2018-08-13 ENCOUNTER — Other Ambulatory Visit: Payer: Self-pay

## 2018-08-13 ENCOUNTER — Ambulatory Visit (AMBULATORY_SURGERY_CENTER): Payer: 59 | Admitting: Internal Medicine

## 2018-08-13 VITALS — BP 110/73 | HR 57 | Temp 98.7°F | Resp 9 | Ht 74.0 in | Wt 200.0 lb

## 2018-08-13 DIAGNOSIS — K5900 Constipation, unspecified: Secondary | ICD-10-CM

## 2018-08-13 DIAGNOSIS — R194 Change in bowel habit: Secondary | ICD-10-CM

## 2018-08-13 MED ORDER — SODIUM CHLORIDE 0.9 % IV SOLN: 500.0000 mL | Freq: Once | INTRAVENOUS | Status: DC

## 2018-08-13 NOTE — Progress Notes (Signed)
Called to room to assist during endoscopic procedure.  Patient ID and intended procedure confirmed with present staff. Received instructions for my participation in the procedure from the performing physician.  

## 2018-08-13 NOTE — Progress Notes (Signed)
Report to PACU, RN, vss, BBS= Clear.  

## 2018-08-13 NOTE — Progress Notes (Signed)
Pt's states no medical or surgical changes since previsit or office visit.  [place]

## 2018-08-13 NOTE — Op Note (Signed)
[place] Patient Name: Dalton Lopez Procedure Date: 08/13/2018 1:53 PM MRN: [id number] Endoscopist: Docia Chuck. Henrene Pastor , MD Age: 58 Referring MD:  Date of Birth: Feb 05, 1960 Gender: Male Account #: [id number] Procedure:                Colonoscopy with biopsies Indications:              Change in bowel habits, constipation alternating                            with soft stools with associated abdominal                            uncomfortableness for 6 months. Prior colonoscopy                            November 2014 was normal Medicines:                Monitored Anesthesia Care Procedure:                Pre-Anesthesia Assessment:                           - Prior to the procedure, a History and Physical                            was performed, and patient medications and                            allergies were reviewed. The patient's tolerance of                            previous anesthesia was also reviewed. The risks                            and benefits of the procedure and the sedation                            options and risks were discussed with the patient.                            All questions were answered, and informed consent                            was obtained. Prior Anticoagulants: The patient has                            taken no previous anticoagulant or antiplatelet                            agents. ASA Grade Assessment: II - A patient with                            mild systemic disease. After reviewing the risks  and benefits, the patient was deemed in                            satisfactory condition to undergo the procedure.                           After obtaining informed consent, the colonoscope                            was passed under direct vision. Throughout the                            procedure, the patient's blood pressure, pulse, and                            oxygen saturations were monitored  continuously. The                            Colonoscope was introduced through the anus and                            advanced to the the cecum, identified by                            appendiceal orifice and ileocecal valve. The                            ileocecal valve, appendiceal orifice, and rectum                            were photographed. The quality of the bowel                            preparation was excellent. The colonoscopy was                            performed without difficulty. The patient tolerated                            the procedure well. The bowel preparation used was                            SUPREP via split dose instruction. Scope In: 2:09:05 PM Scope Out: 2:28:32 PM Scope Withdrawal Time: 0 hours 10 minutes 54 seconds  Total Procedure Duration: 0 hours 19 minutes 27 seconds  Findings:                 The entire examined colon appeared normal on direct                            and retroflexion views. The colon was redundant.                            Biopsies for histology were taken with a cold  forceps from the entire colon for evaluation of                            microscopic colitis. Complications:            No immediate complications. Estimated blood loss:                            None. Estimated Blood Loss:     Estimated blood loss: none. Impression:               - The entire examined colon is normal on direct and                            retroflexion views. Recommendation:           - Repeat colonoscopy in 10 years for screening                            purposes.                           - Patient has a contact number available for                            emergencies. The signs and symptoms of potential                            delayed complications were discussed with the                            patient. Return to normal activities tomorrow.                            Written discharge  instructions were provided to the                            patient.                           - Resume previous diet.                           - Continue present medications.                           - BEGIN CITRUCEL POWDER. 2 tablespoons daily in 12                            to 14 ounces of water. This should help with bowel                            regularity                           - Await pathology results. Dr. Henrene Pastor will schedule  letter results and any further recommendations Docia Chuck. Henrene Pastor, MD 08/13/2018 2:53:38 PM This report has been signed electronically.

## 2018-08-13 NOTE — Patient Instructions (Signed)
Impression/Recommendations:  Repeat colonoscopy in 10 years for screening.  Resume previous diet. Continue present medications.  Begin Citrucel Powder.  2 Tablespoons daily in 12-14 oz. Of water.  Await pathology results.    YOU HAD AN ENDOSCOPIC PROCEDURE TODAY AT [facility]:   Refer to the procedure report that was given to you for any specific questions about what was found during the examination.  If the procedure report does not answer your questions, please call your gastroenterologist to clarify.  If you requested that your care partner not be given the details of your procedure findings, then the procedure report has been included in a sealed envelope for you to review at your convenience later.  YOU SHOULD EXPECT: Some feelings of bloating in the abdomen. Passage of more gas than usual.  Walking can help get rid of the air that was put into your GI tract during the procedure and reduce the bloating. If you had a lower endoscopy (such as a colonoscopy or flexible sigmoidoscopy) you may notice spotting of blood in your stool or on the toilet paper. If you underwent a bowel prep for your procedure, you may not have a normal bowel movement for a few days.  Please Note:  You might notice some irritation and congestion in your nose or some drainage.  This is from the oxygen used during your procedure.  There is no need for concern and it should clear up in a day or so.  SYMPTOMS TO REPORT IMMEDIATELY:   Following lower endoscopy (colonoscopy or flexible sigmoidoscopy):  Excessive amounts of blood in the stool  Significant tenderness or worsening of abdominal pains  Swelling of the abdomen that is new, acute  Fever of 100F or higher For urgent or emergent issues, a gastroenterologist can be reached at any hour by calling [phone].   DIET:  We do recommend a small meal at first, but then you may proceed to your regular diet.  Drink plenty of fluids but you should  avoid alcoholic beverages for 24 hours.  ACTIVITY:  You should plan to take it easy for the rest of today and you should NOT DRIVE or use heavy machinery until tomorrow (because of the sedation medicines used during the test).    FOLLOW UP: Our staff will call the number listed on your records 48-72 hours following your procedure to check on you and address any questions or concerns that you may have regarding the information given to you following your procedure. If we do not reach you, we will leave a message.  We will attempt to reach you two times.  During this call, we will ask if you have developed any symptoms of COVID 19. If you develop any symptoms (ie: fever, flu-like symptoms, shortness of breath, cough etc.) before then, please call [phone].  If you test positive for Covid 19 in the 2 weeks post procedure, please call and report this information to [place].    If any biopsies were taken you will be contacted by phone or by letter within the next 1-3 weeks.  Please call us at [phone] if you have not heard about the biopsies in 3 weeks.    SIGNATURES/CONFIDENTIALITY: You and/or your care partner have signed paperwork which will be entered into your electronic medical record.  These signatures attest to the fact that that the information above on your After Visit Summary has been reviewed and is understood.  Full responsibility of the confidentiality of this discharge  information lies with you and/or your care-partner. 

## 2018-08-17 ENCOUNTER — Ambulatory Visit (INDEPENDENT_AMBULATORY_CARE_PROVIDER_SITE_OTHER): Payer: 59

## 2018-08-17 ENCOUNTER — Telehealth: Payer: Self-pay

## 2018-08-17 ENCOUNTER — Encounter: Payer: Self-pay | Admitting: Internal Medicine

## 2018-08-17 DIAGNOSIS — J309 Allergic rhinitis, unspecified: Secondary | ICD-10-CM | POA: Diagnosis not present

## 2018-08-17 NOTE — Telephone Encounter (Signed)
  Follow up Call-  Call back number 08/13/2018  Post procedure Call Back phone  # [phone]  Permission to leave phone message Yes  Some recent data might be hidden     Patient questions:  Do you have a fever, pain , or abdominal swelling? No. Pain Score  0 *  Have you tolerated food without any problems? Yes.    Have you been able to return to your normal activities? Yes.    Do you have any questions about your discharge instructions: Diet   No. Medications  No. Follow up visit  No.  Do you have questions or concerns about your Care? No.  Actions: * If pain score is 4 or above: No action needed, pain <4.  1. Have you developed a fever since your procedure? no  2.   Have you had an respiratory symptoms (SOB or cough) since your procedure? no  3.   Have you tested positive for COVID 19 since your procedure no  4.   Have you had any family members/close contacts diagnosed with the COVID 19 since your procedure?  no   If yes to any of these questions please route to Joylene John, RN and Alphonsa Gin, Therapist, sports.

## 2018-08-24 ENCOUNTER — Ambulatory Visit (INDEPENDENT_AMBULATORY_CARE_PROVIDER_SITE_OTHER): Payer: 59 | Admitting: *Deleted

## 2018-08-24 DIAGNOSIS — J309 Allergic rhinitis, unspecified: Secondary | ICD-10-CM | POA: Diagnosis not present

## 2018-09-15 ENCOUNTER — Ambulatory Visit (INDEPENDENT_AMBULATORY_CARE_PROVIDER_SITE_OTHER): Payer: 59

## 2018-09-15 DIAGNOSIS — J309 Allergic rhinitis, unspecified: Secondary | ICD-10-CM | POA: Diagnosis not present

## 2018-09-22 ENCOUNTER — Ambulatory Visit (INDEPENDENT_AMBULATORY_CARE_PROVIDER_SITE_OTHER): Payer: 59 | Admitting: *Deleted

## 2018-09-22 DIAGNOSIS — J309 Allergic rhinitis, unspecified: Secondary | ICD-10-CM

## 2018-09-30 ENCOUNTER — Ambulatory Visit (INDEPENDENT_AMBULATORY_CARE_PROVIDER_SITE_OTHER): Payer: 59 | Admitting: *Deleted

## 2018-09-30 DIAGNOSIS — J309 Allergic rhinitis, unspecified: Secondary | ICD-10-CM | POA: Diagnosis not present

## 2018-10-05 ENCOUNTER — Ambulatory Visit (INDEPENDENT_AMBULATORY_CARE_PROVIDER_SITE_OTHER): Payer: 59 | Admitting: *Deleted

## 2018-10-05 DIAGNOSIS — J309 Allergic rhinitis, unspecified: Secondary | ICD-10-CM

## 2018-10-26 DIAGNOSIS — R293 Abnormal posture: Secondary | ICD-10-CM | POA: Diagnosis not present

## 2018-10-26 DIAGNOSIS — M79602 Pain in left arm: Secondary | ICD-10-CM | POA: Diagnosis not present

## 2018-10-26 DIAGNOSIS — M6281 Muscle weakness (generalized): Secondary | ICD-10-CM | POA: Diagnosis not present

## 2018-10-26 DIAGNOSIS — M542 Cervicalgia: Secondary | ICD-10-CM | POA: Diagnosis not present

## 2018-10-27 DIAGNOSIS — Z0000 Encounter for general adult medical examination without abnormal findings: Secondary | ICD-10-CM | POA: Diagnosis not present

## 2018-10-27 DIAGNOSIS — E041 Nontoxic single thyroid nodule: Secondary | ICD-10-CM | POA: Diagnosis not present

## 2018-10-27 DIAGNOSIS — R7989 Other specified abnormal findings of blood chemistry: Secondary | ICD-10-CM | POA: Diagnosis not present

## 2018-10-27 DIAGNOSIS — Z125 Encounter for screening for malignant neoplasm of prostate: Secondary | ICD-10-CM | POA: Diagnosis not present

## 2018-11-01 DIAGNOSIS — M79602 Pain in left arm: Secondary | ICD-10-CM | POA: Diagnosis not present

## 2018-11-01 DIAGNOSIS — M6281 Muscle weakness (generalized): Secondary | ICD-10-CM | POA: Diagnosis not present

## 2018-11-01 DIAGNOSIS — M542 Cervicalgia: Secondary | ICD-10-CM | POA: Diagnosis not present

## 2018-11-01 DIAGNOSIS — R293 Abnormal posture: Secondary | ICD-10-CM | POA: Diagnosis not present

## 2018-11-02 ENCOUNTER — Ambulatory Visit (INDEPENDENT_AMBULATORY_CARE_PROVIDER_SITE_OTHER): Payer: 59 | Admitting: *Deleted

## 2018-11-02 DIAGNOSIS — G8929 Other chronic pain: Secondary | ICD-10-CM | POA: Diagnosis not present

## 2018-11-02 DIAGNOSIS — M545 Low back pain: Secondary | ICD-10-CM | POA: Diagnosis not present

## 2018-11-02 DIAGNOSIS — J309 Allergic rhinitis, unspecified: Secondary | ICD-10-CM | POA: Diagnosis not present

## 2018-11-02 DIAGNOSIS — M542 Cervicalgia: Secondary | ICD-10-CM | POA: Diagnosis not present

## 2018-11-03 DIAGNOSIS — M5380 Other specified dorsopathies, site unspecified: Secondary | ICD-10-CM | POA: Diagnosis not present

## 2018-11-03 DIAGNOSIS — M4802 Spinal stenosis, cervical region: Secondary | ICD-10-CM | POA: Diagnosis not present

## 2018-11-03 DIAGNOSIS — M5412 Radiculopathy, cervical region: Secondary | ICD-10-CM | POA: Diagnosis not present

## 2018-11-03 DIAGNOSIS — M1990 Unspecified osteoarthritis, unspecified site: Secondary | ICD-10-CM | POA: Diagnosis not present

## 2018-11-03 DIAGNOSIS — Z23 Encounter for immunization: Secondary | ICD-10-CM | POA: Diagnosis not present

## 2018-11-03 DIAGNOSIS — Z0000 Encounter for general adult medical examination without abnormal findings: Secondary | ICD-10-CM | POA: Diagnosis not present

## 2018-11-11 DIAGNOSIS — M542 Cervicalgia: Secondary | ICD-10-CM | POA: Diagnosis not present

## 2018-11-11 DIAGNOSIS — R293 Abnormal posture: Secondary | ICD-10-CM | POA: Diagnosis not present

## 2018-11-11 DIAGNOSIS — M79602 Pain in left arm: Secondary | ICD-10-CM | POA: Diagnosis not present

## 2018-11-11 DIAGNOSIS — M6281 Muscle weakness (generalized): Secondary | ICD-10-CM | POA: Diagnosis not present

## 2018-11-30 ENCOUNTER — Ambulatory Visit (INDEPENDENT_AMBULATORY_CARE_PROVIDER_SITE_OTHER): Payer: 59 | Admitting: *Deleted

## 2018-11-30 DIAGNOSIS — J309 Allergic rhinitis, unspecified: Secondary | ICD-10-CM | POA: Diagnosis not present

## 2018-12-08 DIAGNOSIS — H43813 Vitreous degeneration, bilateral: Secondary | ICD-10-CM | POA: Diagnosis not present

## 2018-12-08 DIAGNOSIS — H43393 Other vitreous opacities, bilateral: Secondary | ICD-10-CM | POA: Diagnosis not present

## 2018-12-08 DIAGNOSIS — H11153 Pinguecula, bilateral: Secondary | ICD-10-CM | POA: Diagnosis not present

## 2018-12-13 DIAGNOSIS — M542 Cervicalgia: Secondary | ICD-10-CM | POA: Diagnosis not present

## 2018-12-13 DIAGNOSIS — R293 Abnormal posture: Secondary | ICD-10-CM | POA: Diagnosis not present

## 2018-12-13 DIAGNOSIS — M79602 Pain in left arm: Secondary | ICD-10-CM | POA: Diagnosis not present

## 2018-12-13 DIAGNOSIS — M6281 Muscle weakness (generalized): Secondary | ICD-10-CM | POA: Diagnosis not present

## 2018-12-14 DIAGNOSIS — M545 Low back pain: Secondary | ICD-10-CM | POA: Diagnosis not present

## 2018-12-14 DIAGNOSIS — M5412 Radiculopathy, cervical region: Secondary | ICD-10-CM | POA: Diagnosis not present

## 2018-12-27 DIAGNOSIS — M79602 Pain in left arm: Secondary | ICD-10-CM | POA: Diagnosis not present

## 2018-12-27 DIAGNOSIS — M6281 Muscle weakness (generalized): Secondary | ICD-10-CM | POA: Diagnosis not present

## 2018-12-27 DIAGNOSIS — R293 Abnormal posture: Secondary | ICD-10-CM | POA: Diagnosis not present

## 2018-12-27 DIAGNOSIS — M542 Cervicalgia: Secondary | ICD-10-CM | POA: Diagnosis not present

## 2018-12-29 ENCOUNTER — Ambulatory Visit (INDEPENDENT_AMBULATORY_CARE_PROVIDER_SITE_OTHER): Payer: 59

## 2018-12-29 DIAGNOSIS — J309 Allergic rhinitis, unspecified: Secondary | ICD-10-CM | POA: Diagnosis not present

## 2019-01-07 DIAGNOSIS — M6281 Muscle weakness (generalized): Secondary | ICD-10-CM | POA: Diagnosis not present

## 2019-01-07 DIAGNOSIS — M79602 Pain in left arm: Secondary | ICD-10-CM | POA: Diagnosis not present

## 2019-01-07 DIAGNOSIS — M542 Cervicalgia: Secondary | ICD-10-CM | POA: Diagnosis not present

## 2019-01-07 DIAGNOSIS — R293 Abnormal posture: Secondary | ICD-10-CM | POA: Diagnosis not present

## 2019-01-10 ENCOUNTER — Other Ambulatory Visit: Payer: Self-pay | Admitting: Allergy and Immunology

## 2019-01-26 ENCOUNTER — Ambulatory Visit (INDEPENDENT_AMBULATORY_CARE_PROVIDER_SITE_OTHER): Payer: 59 | Admitting: *Deleted

## 2019-01-26 DIAGNOSIS — Z85828 Personal history of other malignant neoplasm of skin: Secondary | ICD-10-CM | POA: Diagnosis not present

## 2019-01-26 DIAGNOSIS — J309 Allergic rhinitis, unspecified: Secondary | ICD-10-CM

## 2019-01-26 DIAGNOSIS — L821 Other seborrheic keratosis: Secondary | ICD-10-CM | POA: Diagnosis not present

## 2019-01-26 DIAGNOSIS — C44519 Basal cell carcinoma of skin of other part of trunk: Secondary | ICD-10-CM | POA: Diagnosis not present

## 2019-01-26 DIAGNOSIS — L814 Other melanin hyperpigmentation: Secondary | ICD-10-CM | POA: Diagnosis not present

## 2019-01-26 DIAGNOSIS — L820 Inflamed seborrheic keratosis: Secondary | ICD-10-CM | POA: Diagnosis not present

## 2019-01-26 DIAGNOSIS — L570 Actinic keratosis: Secondary | ICD-10-CM | POA: Diagnosis not present

## 2019-01-31 DIAGNOSIS — J3089 Other allergic rhinitis: Secondary | ICD-10-CM | POA: Diagnosis not present

## 2019-01-31 NOTE — Progress Notes (Signed)
Vials exp 11-20-20 °

## 2019-02-04 DIAGNOSIS — M170 Bilateral primary osteoarthritis of knee: Secondary | ICD-10-CM | POA: Diagnosis not present

## 2019-02-04 DIAGNOSIS — M5412 Radiculopathy, cervical region: Secondary | ICD-10-CM | POA: Diagnosis not present

## 2019-02-04 DIAGNOSIS — M25561 Pain in right knee: Secondary | ICD-10-CM | POA: Diagnosis not present

## 2019-02-04 DIAGNOSIS — M542 Cervicalgia: Secondary | ICD-10-CM | POA: Diagnosis not present

## 2019-02-11 ENCOUNTER — Ambulatory Visit: Payer: BC Managed Care – PPO | Attending: Internal Medicine

## 2019-02-11 DIAGNOSIS — Z20822 Contact with and (suspected) exposure to covid-19: Secondary | ICD-10-CM | POA: Insufficient documentation

## 2019-02-12 LAB — NOVEL CORONAVIRUS, NAA: SARS-CoV-2, NAA: NOT DETECTED

## 2019-02-23 ENCOUNTER — Ambulatory Visit (INDEPENDENT_AMBULATORY_CARE_PROVIDER_SITE_OTHER): Payer: Self-pay

## 2019-02-23 DIAGNOSIS — J309 Allergic rhinitis, unspecified: Secondary | ICD-10-CM

## 2019-03-21 ENCOUNTER — Ambulatory Visit (INDEPENDENT_AMBULATORY_CARE_PROVIDER_SITE_OTHER): Payer: Self-pay

## 2019-03-21 DIAGNOSIS — J309 Allergic rhinitis, unspecified: Secondary | ICD-10-CM

## 2019-05-16 DIAGNOSIS — R6882 Decreased libido: Secondary | ICD-10-CM | POA: Diagnosis not present

## 2019-05-16 DIAGNOSIS — F410 Panic disorder [episodic paroxysmal anxiety] without agoraphobia: Secondary | ICD-10-CM | POA: Diagnosis not present

## 2019-05-16 DIAGNOSIS — R5383 Other fatigue: Secondary | ICD-10-CM | POA: Diagnosis not present

## 2019-05-18 ENCOUNTER — Encounter: Payer: Self-pay | Admitting: Allergy and Immunology

## 2019-05-18 ENCOUNTER — Other Ambulatory Visit: Payer: Self-pay

## 2019-05-18 ENCOUNTER — Ambulatory Visit (INDEPENDENT_AMBULATORY_CARE_PROVIDER_SITE_OTHER): Payer: BC Managed Care – PPO | Admitting: Allergy and Immunology

## 2019-05-18 VITALS — BP 128/80 | HR 80 | Temp 97.4°F | Resp 16 | Ht 74.0 in | Wt 206.4 lb

## 2019-05-18 DIAGNOSIS — J3089 Other allergic rhinitis: Secondary | ICD-10-CM

## 2019-05-18 DIAGNOSIS — J302 Other seasonal allergic rhinitis: Secondary | ICD-10-CM

## 2019-05-18 MED ORDER — METHYLPREDNISOLONE ACETATE 80 MG/ML IJ SUSP
80.0000 mg | Freq: Once | INTRAMUSCULAR | Status: AC
  Administered 2019-05-18: 80 mg via INTRAMUSCULAR

## 2019-05-18 MED ORDER — EPINEPHRINE 0.3 MG/0.3ML IJ SOAJ: INTRAMUSCULAR | 3 refills | Status: DC

## 2019-05-18 NOTE — Patient Instructions (Signed)
   1. Continue a combination of the following:   A. Flonase 1-2 sprays each nostril one time per day    B. montelukast 10 mg daily  2. Continue immunotherapy  3. Depomedrol 80 IM delivered in clinic today   4.  If needed:   A. OTC antihistamine - Zyrtec/Claritin/Allegra 1-2 times a day  B. Pataday 1 drop each eye two times per day.  5. Sports glasses while outdoors during spring  6. Return to clinic in 1 year or earlier if problem

## 2019-05-18 NOTE — Progress Notes (Signed)
[place] [facility] [place] [facility] [place]   Follow-up Note  Referring Provider: Burnard Bunting, MD Primary Provider: Burnard Bunting, MD Date of Office Visit: 05/18/2019  Subjective:   Dalton Lopez (DOB: Nov 08, 1960) is a 59 y.o. male who returns to the [facility] on 05/18/2019 in re-evaluation of the following:  HPI: Dalton Lopez returns to this clinic in reevaluation of allergic rhinoconjunctivitis.  His last visit to this clinic was 04 May 2018.  Since the spring has arrived he has had a little bit more problems with itchy eyes and watery eyes and some nasal congestion.  This occurs even though he consistently uses antihistamines and nasal steroids and montelukast.  He has continued to use immunotherapy currently at every 4 weeks without any adverse effect.  This immunotherapy has resulted in very good control regarding his dog allergy and most of his allergies other than his springtime flare.  He has received 2 Covid vaccinations with his last vaccination 4 weeks ago  Allergies as of 05/18/2019   No Known Allergies     Medication List      Crisaborole 2 % Oint Commonly known as: [place] Apply 1 application topically 2 (two) times daily as needed.   EPINEPHrine 0.3 mg/0.3 mL Soaj injection Commonly known as: EpiPen 2-Pak Inject 0.3 mLs (0.3 mg total) into the muscle Once PRN.   Fish Oil 1200 MG Caps Take 1,200 mg by mouth daily.   fluticasone 50 MCG/ACT nasal spray Commonly known as: FLONASE Place 1-2 sprays into both nostrils daily.   loratadine 10 MG tablet Commonly known as: CLARITIN Take 10 mg by mouth daily.   Magnesium 500 MG Caps Take by mouth daily.   meloxicam 15 MG tablet Commonly known as: MOBIC Take 15 mg by mouth daily.   montelukast 10 MG tablet Commonly known as: SINGULAIR TAKE ONE TABLET AT BEDTIME   multivitamin with minerals Tabs tablet Take 1 tablet by mouth daily.   olopatadine 0.1 % ophthalmic  solution Commonly known as: Patanol Place 1 drop into the right eye 2 (two) times daily.   PARoxetine 20 MG tablet Commonly known as: PAXIL Take 20 mg by mouth daily.   potassium citrate 10 MEQ (1080 MG) SR tablet Commonly known as: UROCIT-K potassium citrate ER 10 mEq (1,080 mg) tablet,extended release   SUDAFED PO Take by mouth as needed.   TYLENOL PO Take 650 mg by mouth daily. Take 2 pills daily       Past Medical History:  Diagnosis Date  . Actinic keratosis   . Allergy    seasonal  . Anxiety   . Arthritis    bil knees, back  . Change in bowel habits   . Constipation   . DDD (degenerative disc disease), lumbar   . History of kidney stones   . History of moderate sun exposure   . Post-operative nausea and vomiting   . Skin cancer    basal cell , squamous cell  . Thyroid nodule    benign    Past Surgical History:  Procedure Laterality Date  . ACHILLES TENDON REPAIR     right   . FOOT SURGERY Left 2011   joint fusion repair with screws left foot  . KNEE ARTHROSCOPY Bilateral 11/2011   Bil  . MEDIAL PARTIAL KNEE REPLACEMENT Left   . MOHS SURGERY     multiple  . SKIN BIOPSY  11/17/13   forehead, left temple, central nose tip  Review of systems negative except as noted in HPI / PMHx or noted below:  Review of Systems  Constitutional: Negative.   HENT: Negative.   Eyes: Negative.   Respiratory: Negative.   Cardiovascular: Negative.   Gastrointestinal: Negative.   Genitourinary: Negative.   Musculoskeletal: Negative.   Skin: Negative.   Neurological: Negative.   Endo/Heme/Allergies: Negative.   Psychiatric/Behavioral: Negative.      Objective:   There were no vitals filed for this visit.        Physical Exam Constitutional:      Appearance: He is not diaphoretic.  HENT:     Head: Normocephalic.     Right Ear: Tympanic membrane, ear canal and external ear normal.     Left Ear: Tympanic membrane, ear canal and external ear normal.      Nose: Nose normal. No mucosal edema or rhinorrhea.     Mouth/Throat:     Pharynx: Uvula midline. No oropharyngeal exudate.  Eyes:     Conjunctiva/sclera: Conjunctivae normal.  Neck:     Thyroid: No thyromegaly.     Trachea: Trachea normal. No tracheal tenderness or tracheal deviation.  Cardiovascular:     Rate and Rhythm: Normal rate and regular rhythm.     Heart sounds: Normal heart sounds, S1 normal and S2 normal. No murmur.  Pulmonary:     Effort: No respiratory distress.     Breath sounds: Normal breath sounds. No stridor. No wheezing or rales.  Lymphadenopathy:     Head:     Right side of head: No tonsillar adenopathy.     Left side of head: No tonsillar adenopathy.     Cervical: No cervical adenopathy.  Skin:    Findings: No erythema or rash.     Nails: There is no clubbing.  Neurological:     Mental Status: He is alert.     Diagnostics: none  Assessment and Plan:   1. Seasonal and perennial allergic rhinitis      1. Continue a combination of the following:   A. Flonase 1-2 sprays each nostril one time per day    B. montelukast 10 mg daily  2. Continue immunotherapy  3. Depomedrol 80 IM delivered in clinic today   4.  If needed:   A. OTC antihistamine - Zyrtec/Claritin/Allegra 1-2 times a day  B. Pataday 1 drop each eye two times per day.  5. Sports glasses while outdoors during spring  6. Return to clinic in 1 year or earlier if problem  We have given Dalton Lopez a systemic steroid to help him through his springtime flare.  We also made a few suggestions about utilizing an eyedrop and some sports glasses while outdoors during the spring.  He will continue on immunotherapy and his anti-inflammatory agents for his airway as noted above and I will see him back in his clinic in 1 year or earlier if there is a problem.   Allena Katz, MD Allergy / Immunology [place]

## 2019-05-19 ENCOUNTER — Encounter: Payer: Self-pay | Admitting: Allergy and Immunology

## 2019-05-25 DIAGNOSIS — F410 Panic disorder [episodic paroxysmal anxiety] without agoraphobia: Secondary | ICD-10-CM | POA: Diagnosis not present

## 2019-05-25 DIAGNOSIS — R5383 Other fatigue: Secondary | ICD-10-CM | POA: Diagnosis not present

## 2019-05-27 ENCOUNTER — Ambulatory Visit (INDEPENDENT_AMBULATORY_CARE_PROVIDER_SITE_OTHER): Payer: BC Managed Care – PPO | Admitting: *Deleted

## 2019-05-27 DIAGNOSIS — J309 Allergic rhinitis, unspecified: Secondary | ICD-10-CM | POA: Diagnosis not present

## 2019-05-31 ENCOUNTER — Ambulatory Visit (INDEPENDENT_AMBULATORY_CARE_PROVIDER_SITE_OTHER): Payer: BC Managed Care – PPO

## 2019-05-31 DIAGNOSIS — J309 Allergic rhinitis, unspecified: Secondary | ICD-10-CM

## 2019-06-08 ENCOUNTER — Telehealth: Payer: Self-pay | Admitting: Allergy and Immunology

## 2019-06-08 MED ORDER — OLOPATADINE HCL 0.1 % OP SOLN: 1.0000 [drp] | Freq: Two times a day (BID) | OPHTHALMIC | 5 refills | Status: DC

## 2019-06-08 NOTE — Telephone Encounter (Signed)
Patient called and needs to have Patanol eye drops called into [facility]. [phone]

## 2019-06-08 NOTE — Telephone Encounter (Signed)
Prescription has been sent to pharmacy. Called patient and informed. Patient verbalized understanding.  

## 2019-06-21 ENCOUNTER — Ambulatory Visit (INDEPENDENT_AMBULATORY_CARE_PROVIDER_SITE_OTHER): Payer: BC Managed Care – PPO

## 2019-06-21 DIAGNOSIS — J309 Allergic rhinitis, unspecified: Secondary | ICD-10-CM

## 2019-06-28 ENCOUNTER — Ambulatory Visit (INDEPENDENT_AMBULATORY_CARE_PROVIDER_SITE_OTHER): Payer: BC Managed Care – PPO

## 2019-06-28 DIAGNOSIS — R293 Abnormal posture: Secondary | ICD-10-CM | POA: Diagnosis not present

## 2019-06-28 DIAGNOSIS — M6281 Muscle weakness (generalized): Secondary | ICD-10-CM | POA: Diagnosis not present

## 2019-06-28 DIAGNOSIS — J309 Allergic rhinitis, unspecified: Secondary | ICD-10-CM

## 2019-06-28 DIAGNOSIS — M79602 Pain in left arm: Secondary | ICD-10-CM | POA: Diagnosis not present

## 2019-06-28 DIAGNOSIS — M542 Cervicalgia: Secondary | ICD-10-CM | POA: Diagnosis not present

## 2019-07-01 DIAGNOSIS — M542 Cervicalgia: Secondary | ICD-10-CM | POA: Diagnosis not present

## 2019-07-01 DIAGNOSIS — M79602 Pain in left arm: Secondary | ICD-10-CM | POA: Diagnosis not present

## 2019-07-01 DIAGNOSIS — R293 Abnormal posture: Secondary | ICD-10-CM | POA: Diagnosis not present

## 2019-07-01 DIAGNOSIS — M6281 Muscle weakness (generalized): Secondary | ICD-10-CM | POA: Diagnosis not present

## 2019-07-12 ENCOUNTER — Ambulatory Visit (INDEPENDENT_AMBULATORY_CARE_PROVIDER_SITE_OTHER): Payer: BC Managed Care – PPO

## 2019-07-12 DIAGNOSIS — R293 Abnormal posture: Secondary | ICD-10-CM | POA: Diagnosis not present

## 2019-07-12 DIAGNOSIS — J309 Allergic rhinitis, unspecified: Secondary | ICD-10-CM | POA: Diagnosis not present

## 2019-07-12 DIAGNOSIS — M6281 Muscle weakness (generalized): Secondary | ICD-10-CM | POA: Diagnosis not present

## 2019-07-12 DIAGNOSIS — M542 Cervicalgia: Secondary | ICD-10-CM | POA: Diagnosis not present

## 2019-07-12 DIAGNOSIS — M79602 Pain in left arm: Secondary | ICD-10-CM | POA: Diagnosis not present

## 2019-07-14 DIAGNOSIS — R293 Abnormal posture: Secondary | ICD-10-CM | POA: Diagnosis not present

## 2019-07-14 DIAGNOSIS — M79602 Pain in left arm: Secondary | ICD-10-CM | POA: Diagnosis not present

## 2019-07-14 DIAGNOSIS — M542 Cervicalgia: Secondary | ICD-10-CM | POA: Diagnosis not present

## 2019-07-14 DIAGNOSIS — M6281 Muscle weakness (generalized): Secondary | ICD-10-CM | POA: Diagnosis not present

## 2019-07-18 ENCOUNTER — Ambulatory Visit (INDEPENDENT_AMBULATORY_CARE_PROVIDER_SITE_OTHER): Payer: BC Managed Care – PPO

## 2019-07-18 DIAGNOSIS — J309 Allergic rhinitis, unspecified: Secondary | ICD-10-CM | POA: Diagnosis not present

## 2019-08-01 DIAGNOSIS — L570 Actinic keratosis: Secondary | ICD-10-CM | POA: Diagnosis not present

## 2019-08-01 DIAGNOSIS — Z85828 Personal history of other malignant neoplasm of skin: Secondary | ICD-10-CM | POA: Diagnosis not present

## 2019-08-01 DIAGNOSIS — L821 Other seborrheic keratosis: Secondary | ICD-10-CM | POA: Diagnosis not present

## 2019-08-01 DIAGNOSIS — C44519 Basal cell carcinoma of skin of other part of trunk: Secondary | ICD-10-CM | POA: Diagnosis not present

## 2019-08-01 DIAGNOSIS — D225 Melanocytic nevi of trunk: Secondary | ICD-10-CM | POA: Diagnosis not present

## 2019-08-01 DIAGNOSIS — L814 Other melanin hyperpigmentation: Secondary | ICD-10-CM | POA: Diagnosis not present

## 2019-08-05 ENCOUNTER — Ambulatory Visit (INDEPENDENT_AMBULATORY_CARE_PROVIDER_SITE_OTHER): Payer: BC Managed Care – PPO

## 2019-08-05 DIAGNOSIS — J309 Allergic rhinitis, unspecified: Secondary | ICD-10-CM

## 2019-08-12 ENCOUNTER — Ambulatory Visit (INDEPENDENT_AMBULATORY_CARE_PROVIDER_SITE_OTHER): Payer: BC Managed Care – PPO

## 2019-08-12 DIAGNOSIS — J309 Allergic rhinitis, unspecified: Secondary | ICD-10-CM | POA: Diagnosis not present

## 2019-08-15 ENCOUNTER — Ambulatory Visit: Payer: Self-pay

## 2019-08-18 ENCOUNTER — Ambulatory Visit (INDEPENDENT_AMBULATORY_CARE_PROVIDER_SITE_OTHER): Payer: BC Managed Care – PPO

## 2019-08-18 DIAGNOSIS — J309 Allergic rhinitis, unspecified: Secondary | ICD-10-CM

## 2019-08-25 ENCOUNTER — Ambulatory Visit (INDEPENDENT_AMBULATORY_CARE_PROVIDER_SITE_OTHER): Payer: BC Managed Care – PPO

## 2019-08-25 DIAGNOSIS — J309 Allergic rhinitis, unspecified: Secondary | ICD-10-CM

## 2019-08-30 ENCOUNTER — Ambulatory Visit (INDEPENDENT_AMBULATORY_CARE_PROVIDER_SITE_OTHER): Payer: BC Managed Care – PPO

## 2019-08-30 DIAGNOSIS — J309 Allergic rhinitis, unspecified: Secondary | ICD-10-CM | POA: Diagnosis not present

## 2019-09-12 DIAGNOSIS — M5030 Other cervical disc degeneration, unspecified cervical region: Secondary | ICD-10-CM | POA: Diagnosis not present

## 2019-09-12 DIAGNOSIS — M5136 Other intervertebral disc degeneration, lumbar region: Secondary | ICD-10-CM | POA: Diagnosis not present

## 2019-09-12 DIAGNOSIS — M545 Low back pain: Secondary | ICD-10-CM | POA: Diagnosis not present

## 2019-09-13 DIAGNOSIS — R293 Abnormal posture: Secondary | ICD-10-CM | POA: Diagnosis not present

## 2019-09-13 DIAGNOSIS — M6281 Muscle weakness (generalized): Secondary | ICD-10-CM | POA: Diagnosis not present

## 2019-09-13 DIAGNOSIS — M542 Cervicalgia: Secondary | ICD-10-CM | POA: Diagnosis not present

## 2019-09-13 DIAGNOSIS — M79602 Pain in left arm: Secondary | ICD-10-CM | POA: Diagnosis not present

## 2019-09-22 DIAGNOSIS — R05 Cough: Secondary | ICD-10-CM | POA: Diagnosis not present

## 2019-09-29 ENCOUNTER — Ambulatory Visit (INDEPENDENT_AMBULATORY_CARE_PROVIDER_SITE_OTHER): Payer: BC Managed Care – PPO

## 2019-09-29 DIAGNOSIS — J309 Allergic rhinitis, unspecified: Secondary | ICD-10-CM | POA: Diagnosis not present

## 2019-10-20 ENCOUNTER — Ambulatory Visit (INDEPENDENT_AMBULATORY_CARE_PROVIDER_SITE_OTHER): Payer: BC Managed Care – PPO | Admitting: Allergy and Immunology

## 2019-10-20 ENCOUNTER — Other Ambulatory Visit: Payer: Self-pay

## 2019-10-20 VITALS — BP 126/98 | HR 82 | Resp 16

## 2019-10-20 DIAGNOSIS — J3089 Other allergic rhinitis: Secondary | ICD-10-CM

## 2019-10-20 DIAGNOSIS — J302 Other seasonal allergic rhinitis: Secondary | ICD-10-CM

## 2019-10-20 DIAGNOSIS — Z20822 Contact with and (suspected) exposure to covid-19: Secondary | ICD-10-CM | POA: Diagnosis not present

## 2019-10-20 MED ORDER — METHYLPREDNISOLONE ACETATE 80 MG/ML IJ SUSP
80.0000 mg | Freq: Once | INTRAMUSCULAR | Status: AC
  Administered 2019-10-20: 40 mg via INTRAMUSCULAR

## 2019-10-20 NOTE — Progress Notes (Signed)
[place] [facility] [place] [facility] [place]   Follow-up Note  Referring Provider: Burnard Bunting, MD Primary Provider: Burnard Bunting, MD Date of Office Visit: 10/20/2019  Subjective:   Dalton Lopez (DOB: 23-Aug-1960) is a 59 y.o. male who returns to the [facility] on 10/20/2019 in re-evaluation of the following:  HPI: Dalton Lopez returns to this clinic in evaluation of allergic rhinoconjunctivitis treated with immunotherapy.  His last visit to this clinic was 18 May 2019.  He was really doing very well until approximately 4 days ago at which point time he developed nasal congestion and runny nose and sneezing attacks and itchy eyes and a slight cough.  He has not had any fever or ugly nasal discharge or wheezing or shortness of breath or chest tightness or chest pain or other associated systemic or constitutional symptoms.  It should be noted that his wife had a "head cold" a week before he developed the symptoms.  Otherwise, he was doing very well while using immunotherapy at every 4-week interval and utilizing his medical treatment and did not require systemic steroid or antibiotics since his last visit.  He has received 2 Covid vaccinations.  Allergies as of 10/20/2019   No Known Allergies     Medication List      EPINEPHrine 0.3 mg/0.3 mL Soaj injection Commonly known as: EpiPen 2-Pak Use for life-threatening allergic reactions.   Fish Oil 1200 MG Caps Take 1,200 mg by mouth daily.   fluticasone 50 MCG/ACT nasal spray Commonly known as: FLONASE Place 1-2 sprays into both nostrils daily.   loratadine 10 MG tablet Commonly known as: CLARITIN Take 10 mg by mouth daily.   Magnesium 500 MG Caps Take by mouth daily.   meloxicam 15 MG tablet Commonly known as: MOBIC Take 15 mg by mouth daily.   montelukast 10 MG tablet Commonly known as: SINGULAIR TAKE ONE TABLET AT BEDTIME   multivitamin with minerals Tabs tablet Take 1 tablet by  mouth daily.   olopatadine 0.1 % ophthalmic solution Commonly known as: Patanol Place 1 drop into the right eye 2 (two) times daily.   PARoxetine 20 MG tablet Commonly known as: PAXIL Take 20 mg by mouth daily.   potassium citrate 10 MEQ (1080 MG) SR tablet Commonly known as: UROCIT-K potassium citrate ER 10 mEq (1,080 mg) tablet,extended release   TYLENOL PO Take 650 mg by mouth daily. Take 2 pills daily       Past Medical History:  Diagnosis Date  . Actinic keratosis   . Allergy    seasonal  . Anxiety   . Arthritis    bil knees, back  . Change in bowel habits   . Constipation   . DDD (degenerative disc disease), lumbar   . History of kidney stones   . History of moderate sun exposure   . Post-operative nausea and vomiting   . Skin cancer    basal cell , squamous cell  . Thyroid nodule    benign    Past Surgical History:  Procedure Laterality Date  . ACHILLES TENDON REPAIR     right   . FOOT SURGERY Left 2011   joint fusion repair with screws left foot  . KNEE ARTHROSCOPY Bilateral 11/2011   Bil  . MEDIAL PARTIAL KNEE REPLACEMENT Left   . MOHS SURGERY     multiple  . SKIN BIOPSY  11/17/13   forehead, left temple, central nose tip    Review of  systems negative except as noted in HPI / PMHx or noted below:  Review of Systems  Constitutional: Negative.   HENT: Negative.   Eyes: Negative.   Respiratory: Negative.   Cardiovascular: Negative.   Gastrointestinal: Negative.   Genitourinary: Negative.   Musculoskeletal: Negative.   Skin: Negative.   Neurological: Negative.   Endo/Heme/Allergies: Negative.   Psychiatric/Behavioral: Negative.      Objective:   Vitals:   10/20/19 1336  BP: (!) 126/98  Pulse: 82  Resp: 16  SpO2: 96%          Physical Exam Constitutional:      Appearance: He is not diaphoretic.  HENT:     Head: Normocephalic.     Right Ear: Tympanic membrane, ear canal and external ear normal.     Left Ear: Tympanic  membrane, ear canal and external ear normal.     Nose: Nose normal. No mucosal edema or rhinorrhea.     Mouth/Throat:     Pharynx: Uvula midline. No oropharyngeal exudate.  Eyes:     Conjunctiva/sclera: Conjunctivae normal.  Neck:     Thyroid: No thyromegaly.     Trachea: Trachea normal. No tracheal tenderness or tracheal deviation.  Cardiovascular:     Rate and Rhythm: Normal rate and regular rhythm.     Heart sounds: Normal heart sounds, S1 normal and S2 normal. No murmur heard.   Pulmonary:     Effort: No respiratory distress.     Breath sounds: Normal breath sounds. No stridor. No wheezing or rales.  Lymphadenopathy:     Head:     Right side of head: No tonsillar adenopathy.     Left side of head: No tonsillar adenopathy.     Cervical: No cervical adenopathy.  Skin:    Findings: No erythema or rash.     Nails: There is no clubbing.  Neurological:     Mental Status: He is alert.     Diagnostics: none  Assessment and Plan:   1. Seasonal and perennial allergic rhinitis     1. Continue a combination of the following:   A. Flonase 1-2 sprays each nostril one time per day    B. montelukast 10 mg daily  2. Continue immunotherapy  3. Depomedrol 40 IM delivered in clinic today   4.  If needed:   A. OTC antihistamine - Zyrtec/Claritin/Allegra 1-2 times a day  B. Pataday 1 drop each eye two times per day.  5. Obtain a Covid swab  6. Return to clinic in 1 year or earlier if problem  7. Plan for fall flu vaccine  Dalton Lopez appears to have an inflammatory flare of his upper respiratory tract disease and this may be viral in etiology or may be secondary to the pollen exposure that has increased over the course of the past few weeks.  We will treat him with a relatively low dose of a systemic steroid as noted above and he will continue to utilize his immunotherapy and other anti-inflammatory agents for his airway.  I did recommend that he obtain a Covid swab and if positive  then perform the usual isolation protocol.  I will see him back in his clinic in 1 year or earlier if there is a problem.  Allena Katz, MD Allergy / Immunology [place]

## 2019-10-20 NOTE — Patient Instructions (Addendum)
   1. Continue a combination of the following:   A. Flonase 1-2 sprays each nostril one time per day    B. montelukast 10 mg daily  2. Continue immunotherapy  3. Depomedrol 40 IM delivered in clinic today   4.  If needed:   A. OTC antihistamine - Zyrtec/Claritin/Allegra 1-2 times a day  B. Pataday 1 drop each eye two times per day.  5. Obtain a Covid swab  6. Return to clinic in 1 year or earlier if problem  7. Plan for fall flu vaccine

## 2019-10-24 ENCOUNTER — Encounter: Payer: Self-pay | Admitting: Allergy and Immunology

## 2019-11-08 DIAGNOSIS — Z0000 Encounter for general adult medical examination without abnormal findings: Secondary | ICD-10-CM | POA: Diagnosis not present

## 2019-11-08 DIAGNOSIS — Z125 Encounter for screening for malignant neoplasm of prostate: Secondary | ICD-10-CM | POA: Diagnosis not present

## 2019-11-08 DIAGNOSIS — E041 Nontoxic single thyroid nodule: Secondary | ICD-10-CM | POA: Diagnosis not present

## 2019-11-10 DIAGNOSIS — R82998 Other abnormal findings in urine: Secondary | ICD-10-CM | POA: Diagnosis not present

## 2019-11-10 DIAGNOSIS — E041 Nontoxic single thyroid nodule: Secondary | ICD-10-CM | POA: Diagnosis not present

## 2019-11-10 DIAGNOSIS — Z0000 Encounter for general adult medical examination without abnormal findings: Secondary | ICD-10-CM | POA: Diagnosis not present

## 2019-11-10 DIAGNOSIS — M1990 Unspecified osteoarthritis, unspecified site: Secondary | ICD-10-CM | POA: Diagnosis not present

## 2019-11-10 DIAGNOSIS — Z23 Encounter for immunization: Secondary | ICD-10-CM | POA: Diagnosis not present

## 2019-11-17 ENCOUNTER — Ambulatory Visit (INDEPENDENT_AMBULATORY_CARE_PROVIDER_SITE_OTHER): Payer: BC Managed Care – PPO

## 2019-11-17 DIAGNOSIS — J309 Allergic rhinitis, unspecified: Secondary | ICD-10-CM

## 2019-11-23 ENCOUNTER — Ambulatory Visit (INDEPENDENT_AMBULATORY_CARE_PROVIDER_SITE_OTHER): Payer: BC Managed Care – PPO | Admitting: *Deleted

## 2019-11-23 DIAGNOSIS — J309 Allergic rhinitis, unspecified: Secondary | ICD-10-CM

## 2019-12-08 DIAGNOSIS — L821 Other seborrheic keratosis: Secondary | ICD-10-CM | POA: Diagnosis not present

## 2019-12-08 DIAGNOSIS — D1723 Benign lipomatous neoplasm of skin and subcutaneous tissue of right leg: Secondary | ICD-10-CM | POA: Diagnosis not present

## 2019-12-08 DIAGNOSIS — L814 Other melanin hyperpigmentation: Secondary | ICD-10-CM | POA: Diagnosis not present

## 2019-12-08 DIAGNOSIS — L820 Inflamed seborrheic keratosis: Secondary | ICD-10-CM | POA: Diagnosis not present

## 2019-12-08 DIAGNOSIS — M7061 Trochanteric bursitis, right hip: Secondary | ICD-10-CM | POA: Diagnosis not present

## 2019-12-08 DIAGNOSIS — L570 Actinic keratosis: Secondary | ICD-10-CM | POA: Diagnosis not present

## 2019-12-08 DIAGNOSIS — M25551 Pain in right hip: Secondary | ICD-10-CM | POA: Diagnosis not present

## 2019-12-08 DIAGNOSIS — Z85828 Personal history of other malignant neoplasm of skin: Secondary | ICD-10-CM | POA: Diagnosis not present

## 2019-12-08 DIAGNOSIS — C44712 Basal cell carcinoma of skin of right lower limb, including hip: Secondary | ICD-10-CM | POA: Diagnosis not present

## 2019-12-08 DIAGNOSIS — C44519 Basal cell carcinoma of skin of other part of trunk: Secondary | ICD-10-CM | POA: Diagnosis not present

## 2019-12-19 NOTE — Progress Notes (Signed)
exp 12/20/20

## 2019-12-21 NOTE — Progress Notes (Signed)
VIAL LABEL NEEDED.

## 2019-12-22 ENCOUNTER — Ambulatory Visit (INDEPENDENT_AMBULATORY_CARE_PROVIDER_SITE_OTHER): Payer: BC Managed Care – PPO

## 2019-12-22 DIAGNOSIS — J309 Allergic rhinitis, unspecified: Secondary | ICD-10-CM | POA: Diagnosis not present

## 2019-12-23 DIAGNOSIS — J3089 Other allergic rhinitis: Secondary | ICD-10-CM | POA: Diagnosis not present

## 2020-01-05 DIAGNOSIS — Z1212 Encounter for screening for malignant neoplasm of rectum: Secondary | ICD-10-CM | POA: Diagnosis not present

## 2020-01-23 ENCOUNTER — Ambulatory Visit (INDEPENDENT_AMBULATORY_CARE_PROVIDER_SITE_OTHER): Payer: BC Managed Care – PPO | Admitting: *Deleted

## 2020-01-23 DIAGNOSIS — J309 Allergic rhinitis, unspecified: Secondary | ICD-10-CM

## 2020-02-15 DIAGNOSIS — R059 Cough, unspecified: Secondary | ICD-10-CM | POA: Diagnosis not present

## 2020-02-15 DIAGNOSIS — J320 Chronic maxillary sinusitis: Secondary | ICD-10-CM | POA: Diagnosis not present

## 2020-03-01 ENCOUNTER — Ambulatory Visit (INDEPENDENT_AMBULATORY_CARE_PROVIDER_SITE_OTHER): Payer: BC Managed Care – PPO

## 2020-03-01 DIAGNOSIS — J309 Allergic rhinitis, unspecified: Secondary | ICD-10-CM | POA: Diagnosis not present

## 2020-03-08 ENCOUNTER — Ambulatory Visit (INDEPENDENT_AMBULATORY_CARE_PROVIDER_SITE_OTHER): Payer: BC Managed Care – PPO | Admitting: *Deleted

## 2020-03-08 DIAGNOSIS — J309 Allergic rhinitis, unspecified: Secondary | ICD-10-CM

## 2020-03-14 ENCOUNTER — Ambulatory Visit (INDEPENDENT_AMBULATORY_CARE_PROVIDER_SITE_OTHER): Payer: BC Managed Care – PPO

## 2020-03-14 DIAGNOSIS — J309 Allergic rhinitis, unspecified: Secondary | ICD-10-CM

## 2020-03-22 ENCOUNTER — Ambulatory Visit (INDEPENDENT_AMBULATORY_CARE_PROVIDER_SITE_OTHER): Payer: BC Managed Care – PPO

## 2020-03-22 DIAGNOSIS — J309 Allergic rhinitis, unspecified: Secondary | ICD-10-CM

## 2020-03-26 ENCOUNTER — Ambulatory Visit (INDEPENDENT_AMBULATORY_CARE_PROVIDER_SITE_OTHER): Payer: BC Managed Care – PPO | Admitting: *Deleted

## 2020-03-26 DIAGNOSIS — J309 Allergic rhinitis, unspecified: Secondary | ICD-10-CM

## 2020-04-09 DIAGNOSIS — C44519 Basal cell carcinoma of skin of other part of trunk: Secondary | ICD-10-CM | POA: Diagnosis not present

## 2020-04-09 DIAGNOSIS — L570 Actinic keratosis: Secondary | ICD-10-CM | POA: Diagnosis not present

## 2020-04-09 DIAGNOSIS — L821 Other seborrheic keratosis: Secondary | ICD-10-CM | POA: Diagnosis not present

## 2020-04-09 DIAGNOSIS — Z85828 Personal history of other malignant neoplasm of skin: Secondary | ICD-10-CM | POA: Diagnosis not present

## 2020-04-09 DIAGNOSIS — L814 Other melanin hyperpigmentation: Secondary | ICD-10-CM | POA: Diagnosis not present

## 2020-04-09 DIAGNOSIS — D225 Melanocytic nevi of trunk: Secondary | ICD-10-CM | POA: Diagnosis not present

## 2020-04-16 DIAGNOSIS — R293 Abnormal posture: Secondary | ICD-10-CM | POA: Diagnosis not present

## 2020-04-16 DIAGNOSIS — M79602 Pain in left arm: Secondary | ICD-10-CM | POA: Diagnosis not present

## 2020-04-16 DIAGNOSIS — M6281 Muscle weakness (generalized): Secondary | ICD-10-CM | POA: Diagnosis not present

## 2020-04-16 DIAGNOSIS — M542 Cervicalgia: Secondary | ICD-10-CM | POA: Diagnosis not present

## 2020-04-19 ENCOUNTER — Telehealth: Payer: Self-pay | Admitting: *Deleted

## 2020-04-19 NOTE — Telephone Encounter (Signed)
Called patient and he is scheduling an appointment just for the Depo-Medrol 80 IM injection.

## 2020-04-19 NOTE — Telephone Encounter (Signed)
Depo-Medrol 80 IM delivered in clinic would be fine.  Please contact patient

## 2020-04-19 NOTE — Telephone Encounter (Signed)
Patient called the office to see if he could come in just for Depo-Medrol 80mg  injection instead of having to do office visit and depo.  Patient is going away next Wednesday on a 3 day golf trip and would like injection before he goes on his trip. Patient states he is not having any issues other than allergy issues he has this time of year.  He is being compliant with his allergy injections. Please advise if okay for Depo-Medrol injection only.  Thanks

## 2020-04-20 DIAGNOSIS — M7061 Trochanteric bursitis, right hip: Secondary | ICD-10-CM | POA: Diagnosis not present

## 2020-04-20 DIAGNOSIS — M25551 Pain in right hip: Secondary | ICD-10-CM | POA: Diagnosis not present

## 2020-04-23 ENCOUNTER — Ambulatory Visit: Payer: BC Managed Care – PPO | Admitting: Family

## 2020-04-23 ENCOUNTER — Ambulatory Visit (INDEPENDENT_AMBULATORY_CARE_PROVIDER_SITE_OTHER): Payer: BC Managed Care – PPO

## 2020-04-23 ENCOUNTER — Ambulatory Visit: Payer: BC Managed Care – PPO

## 2020-04-23 DIAGNOSIS — J309 Allergic rhinitis, unspecified: Secondary | ICD-10-CM

## 2020-04-23 DIAGNOSIS — M79602 Pain in left arm: Secondary | ICD-10-CM | POA: Diagnosis not present

## 2020-04-23 DIAGNOSIS — M6281 Muscle weakness (generalized): Secondary | ICD-10-CM | POA: Diagnosis not present

## 2020-04-23 DIAGNOSIS — R293 Abnormal posture: Secondary | ICD-10-CM | POA: Diagnosis not present

## 2020-04-23 DIAGNOSIS — M542 Cervicalgia: Secondary | ICD-10-CM | POA: Diagnosis not present

## 2020-04-25 ENCOUNTER — Ambulatory Visit: Payer: BC Managed Care – PPO | Admitting: Allergy and Immunology

## 2020-04-25 ENCOUNTER — Encounter: Payer: Self-pay | Admitting: Allergy and Immunology

## 2020-04-25 ENCOUNTER — Other Ambulatory Visit: Payer: Self-pay

## 2020-04-25 VITALS — BP 120/82 | HR 92 | Resp 16

## 2020-04-25 DIAGNOSIS — J302 Other seasonal allergic rhinitis: Secondary | ICD-10-CM

## 2020-04-25 DIAGNOSIS — J3089 Other allergic rhinitis: Secondary | ICD-10-CM | POA: Diagnosis not present

## 2020-04-25 MED ORDER — METHYLPREDNISOLONE ACETATE 80 MG/ML IJ SUSP
80.0000 mg | Freq: Once | INTRAMUSCULAR | Status: AC
  Administered 2020-04-25: 80 mg via INTRAMUSCULAR

## 2020-04-25 NOTE — Patient Instructions (Addendum)
   1. Continue Flonase 1-2 sprays each nostril one time per day    2. Continue immunotherapy  3. Depomedrol 80 IM delivered in clinic today   4.  If needed:   A. OTC antihistamine - Zyrtec/Claritin/Allegra 1-2 times a day  B. Pataday 1 drop each eye two times per day.  5. Return to clinic in 1 year or earlier if problem

## 2020-04-25 NOTE — Progress Notes (Signed)
[place] [facility] [place] [facility] [place]   Follow-up Note  Referring Provider: Burnard Bunting, MD Primary Provider: Burnard Bunting, MD Date of Office Visit: 04/25/2020  Subjective:   Dalton Lopez (DOB: June 21, 1960) is a 60 y.o. male who returns to the [facility] on 04/25/2020 in re-evaluation of the following:  HPI: Dalton Lopez returns to this clinic in evaluation of allergic rhinoconjunctivitis treated with immunotherapy.  His last visit to this clinic was 19 April 2019.  He has been noticing some very significant eye irritation and nasal irritation and throat irritation when being outdoors.  He has requesting a injection of Depo-Medrol which he receives just about every spring to deal with his springtime flare.  As well, he has a new dog located inside the household and after playing with the dog extensively he developed some red blotchiness on his skin.  His immunotherapy is going quite well at this point in time without any adverse effect.  He continues to use Flonase on a pretty regular basis through the spring.  He has discontinued his montelukast because of CNS side effects.  He remains on an antihistamine on most days.  Allergies as of 04/25/2020   No Known Allergies     Medication List    EPINEPHrine 0.3 mg/0.3 mL Soaj injection Commonly known as: EpiPen 2-Pak Use for life-threatening allergic reactions.   Fish Oil 1200 MG Caps Take 1,200 mg by mouth daily.   fluticasone 50 MCG/ACT nasal spray Commonly known as: FLONASE Place 1-2 sprays into both nostrils daily.   Magnesium 500 MG Caps Take by mouth daily.   meloxicam 15 MG tablet Commonly known as: MOBIC Take 1 tablet by mouth daily.   montelukast 10 MG tablet Commonly known as: SINGULAIR TAKE ONE TABLET AT BEDTIME   multivitamin with minerals Tabs tablet Take 1 tablet by mouth daily.   olopatadine 0.1 % ophthalmic solution Commonly known as: Patanol Place 1 drop into  the right eye 2 (two) times daily.   PARoxetine 20 MG tablet Commonly known as: PAXIL Take 20 mg by mouth daily.   TYLENOL PO Take 650 mg by mouth daily. Take 2 pills daily       Past Medical History:  Diagnosis Date  . Actinic keratosis   . Allergy    seasonal  . Anxiety   . Arthritis    bil knees, back  . Change in bowel habits   . Constipation   . DDD (degenerative disc disease), lumbar   . History of kidney stones   . History of moderate sun exposure   . Post-operative nausea and vomiting   . Skin cancer    basal cell , squamous cell  . Thyroid nodule    benign    Past Surgical History:  Procedure Laterality Date  . ACHILLES TENDON REPAIR     right   . FOOT SURGERY Left 2011   joint fusion repair with screws left foot  . KNEE ARTHROSCOPY Bilateral 11/2011   Bil  . MEDIAL PARTIAL KNEE REPLACEMENT Left   . MOHS SURGERY     multiple  . SKIN BIOPSY  11/17/13   forehead, left temple, central nose tip    Review of systems negative except as noted in HPI / PMHx or noted below:  Review of Systems  Constitutional: Negative.   HENT: Negative.   Eyes: Negative.   Respiratory: Negative.   Cardiovascular: Negative.   Gastrointestinal: Negative.   Genitourinary: Negative.  Musculoskeletal: Negative.   Skin: Negative.   Neurological: Negative.   Endo/Heme/Allergies: Negative.   Psychiatric/Behavioral: Negative.      Objective:   Vitals:   04/25/20 1345  BP: 120/82  Pulse: 92  Resp: 16          Physical Exam Constitutional:      Appearance: He is not diaphoretic.  HENT:     Head: Normocephalic.     Right Ear: Tympanic membrane, ear canal and external ear normal.     Left Ear: Tympanic membrane, ear canal and external ear normal.     Nose: Nose normal. No mucosal edema or rhinorrhea.     Mouth/Throat:     Pharynx: Uvula midline. No oropharyngeal exudate.  Eyes:     Conjunctiva/sclera: Conjunctivae normal.  Neck:     Thyroid: No thyromegaly.      Trachea: Trachea normal. No tracheal tenderness or tracheal deviation.  Cardiovascular:     Rate and Rhythm: Normal rate and regular rhythm.     Heart sounds: Normal heart sounds, S1 normal and S2 normal. No murmur heard.   Pulmonary:     Effort: No respiratory distress.     Breath sounds: Normal breath sounds. No stridor. No wheezing or rales.  Lymphadenopathy:     Head:     Right side of head: No tonsillar adenopathy.     Left side of head: No tonsillar adenopathy.     Cervical: No cervical adenopathy.  Skin:    Findings: No erythema or rash.     Nails: There is no clubbing.  Neurological:     Mental Status: He is alert.     Diagnostics: none   Assessment and Plan:   1. Seasonal and perennial allergic rhinitis      1. Continue Flonase 1-2 sprays each nostril one time per day    2. Continue immunotherapy  3. Depomedrol 80 IM delivered in clinic today   4.  If needed:   A. OTC antihistamine - Zyrtec/Claritin/Allegra 1-2 times a day  B. Pataday 1 drop each eye two times per day.  5. Return to clinic in 1 year or earlier if problem  Dalton Lopez appears to have some issues with springtime flare of her his atopic disease and we will treat him with the therapy noted above and assume he will continue to do well with of the majority of the year and see him back in his clinic in 1 year or earlier if problem while utilizing the plan noted above  Allena Katz, MD Allergy / [place]

## 2020-04-26 ENCOUNTER — Encounter: Payer: Self-pay | Admitting: Allergy and Immunology

## 2020-04-29 DIAGNOSIS — Z20822 Contact with and (suspected) exposure to covid-19: Secondary | ICD-10-CM | POA: Diagnosis not present

## 2020-05-03 ENCOUNTER — Telehealth: Payer: Self-pay | Admitting: Allergy and Immunology

## 2020-05-03 NOTE — Telephone Encounter (Signed)
Called and advised to patient. Patient verbalized understanding.  

## 2020-05-03 NOTE — Telephone Encounter (Signed)
Patient states after getting the Depo shot last Wednesday, he woke up at 4am on Friday with a high fever and he has had a low to medium fever on and off ever since. Patient has little congestion, body aches, no coughing, no sneezing. Patient went to urgent care on Sunday and was negative to flu and covid. The doctor at the urgent care states even though the flu was negative his symptoms are likely a flu-like virus. No medications were prescribed at the urgent care.  Clinical research associate.  Please advise.

## 2020-05-03 NOTE — Telephone Encounter (Signed)
Please advise 

## 2020-05-03 NOTE — Telephone Encounter (Signed)
Please inform Skip that this does sound like a viral syndrome of some type whether it be influenza or some other virus.  We usually treat this with analgesic agents such as ibuprofen or Motrin and keep well-hydrated and over time everything will resolve.  Hopefully by 2 weeks everything will be completely gone.  There is no specific medicine that we use to address this issue unless of course it becomes a secondary bacterial infection which is relatively uncommon

## 2020-05-28 ENCOUNTER — Ambulatory Visit (INDEPENDENT_AMBULATORY_CARE_PROVIDER_SITE_OTHER): Payer: BC Managed Care – PPO

## 2020-05-28 DIAGNOSIS — J309 Allergic rhinitis, unspecified: Secondary | ICD-10-CM | POA: Diagnosis not present

## 2020-06-21 DIAGNOSIS — Z20822 Contact with and (suspected) exposure to covid-19: Secondary | ICD-10-CM | POA: Diagnosis not present

## 2020-07-04 ENCOUNTER — Ambulatory Visit (INDEPENDENT_AMBULATORY_CARE_PROVIDER_SITE_OTHER): Payer: BC Managed Care – PPO

## 2020-07-04 DIAGNOSIS — J309 Allergic rhinitis, unspecified: Secondary | ICD-10-CM

## 2020-08-03 ENCOUNTER — Ambulatory Visit (INDEPENDENT_AMBULATORY_CARE_PROVIDER_SITE_OTHER): Payer: BC Managed Care – PPO

## 2020-08-03 DIAGNOSIS — J309 Allergic rhinitis, unspecified: Secondary | ICD-10-CM | POA: Diagnosis not present

## 2020-08-07 DIAGNOSIS — J3089 Other allergic rhinitis: Secondary | ICD-10-CM | POA: Diagnosis not present

## 2020-08-07 NOTE — Progress Notes (Signed)
VIALS MADE. EXP 08-07-21

## 2020-08-09 DIAGNOSIS — C44612 Basal cell carcinoma of skin of right upper limb, including shoulder: Secondary | ICD-10-CM | POA: Diagnosis not present

## 2020-08-09 DIAGNOSIS — Z85828 Personal history of other malignant neoplasm of skin: Secondary | ICD-10-CM | POA: Diagnosis not present

## 2020-08-09 DIAGNOSIS — L2089 Other atopic dermatitis: Secondary | ICD-10-CM | POA: Diagnosis not present

## 2020-08-09 DIAGNOSIS — L814 Other melanin hyperpigmentation: Secondary | ICD-10-CM | POA: Diagnosis not present

## 2020-08-09 DIAGNOSIS — C44712 Basal cell carcinoma of skin of right lower limb, including hip: Secondary | ICD-10-CM | POA: Diagnosis not present

## 2020-08-09 DIAGNOSIS — L821 Other seborrheic keratosis: Secondary | ICD-10-CM | POA: Diagnosis not present

## 2020-08-09 DIAGNOSIS — L570 Actinic keratosis: Secondary | ICD-10-CM | POA: Diagnosis not present

## 2020-08-23 ENCOUNTER — Ambulatory Visit (INDEPENDENT_AMBULATORY_CARE_PROVIDER_SITE_OTHER): Payer: BC Managed Care – PPO

## 2020-08-23 DIAGNOSIS — J309 Allergic rhinitis, unspecified: Secondary | ICD-10-CM | POA: Diagnosis not present

## 2020-09-13 ENCOUNTER — Telehealth: Payer: Self-pay

## 2020-09-13 NOTE — Telephone Encounter (Signed)
Left a message for patient to call the office to set up an immunotherapy appointment to receive Depo-Medrol 80 IM.

## 2020-09-13 NOTE — Telephone Encounter (Signed)
Please contact patient and arrange to administer Depo-Medrol 80 IM for few days before he goes to visit his brother with dog exposure

## 2020-09-13 NOTE — Telephone Encounter (Signed)
Patient called requesting a depo injection before he goes to visit his brother next week who has two dogs. Patient states the last time he went he suffered very badly.   Patient leaves September 1st.  Please Advise.

## 2020-09-14 NOTE — Telephone Encounter (Signed)
Patient called back and has been scheduled

## 2020-09-20 ENCOUNTER — Ambulatory Visit (INDEPENDENT_AMBULATORY_CARE_PROVIDER_SITE_OTHER): Payer: BC Managed Care – PPO | Admitting: *Deleted

## 2020-09-20 ENCOUNTER — Other Ambulatory Visit: Payer: Self-pay

## 2020-09-20 DIAGNOSIS — J3089 Other allergic rhinitis: Secondary | ICD-10-CM

## 2020-09-20 DIAGNOSIS — J302 Other seasonal allergic rhinitis: Secondary | ICD-10-CM | POA: Diagnosis not present

## 2020-09-20 MED ORDER — METHYLPREDNISOLONE ACETATE 80 MG/ML IJ SUSP
80.0000 mg | Freq: Once | INTRAMUSCULAR | Status: AC
  Administered 2020-09-20: 80 mg via INTRAMUSCULAR

## 2020-09-27 DIAGNOSIS — L820 Inflamed seborrheic keratosis: Secondary | ICD-10-CM | POA: Diagnosis not present

## 2020-09-27 DIAGNOSIS — C44612 Basal cell carcinoma of skin of right upper limb, including shoulder: Secondary | ICD-10-CM | POA: Diagnosis not present

## 2020-10-22 ENCOUNTER — Ambulatory Visit (INDEPENDENT_AMBULATORY_CARE_PROVIDER_SITE_OTHER): Payer: BC Managed Care – PPO

## 2020-10-22 DIAGNOSIS — J309 Allergic rhinitis, unspecified: Secondary | ICD-10-CM

## 2020-10-31 DIAGNOSIS — N401 Enlarged prostate with lower urinary tract symptoms: Secondary | ICD-10-CM | POA: Diagnosis not present

## 2020-10-31 DIAGNOSIS — N200 Calculus of kidney: Secondary | ICD-10-CM | POA: Diagnosis not present

## 2020-10-31 DIAGNOSIS — R351 Nocturia: Secondary | ICD-10-CM | POA: Diagnosis not present

## 2020-11-05 DIAGNOSIS — Z23 Encounter for immunization: Secondary | ICD-10-CM | POA: Diagnosis not present

## 2020-11-21 ENCOUNTER — Ambulatory Visit (INDEPENDENT_AMBULATORY_CARE_PROVIDER_SITE_OTHER): Payer: BC Managed Care – PPO

## 2020-11-21 DIAGNOSIS — Z125 Encounter for screening for malignant neoplasm of prostate: Secondary | ICD-10-CM | POA: Diagnosis not present

## 2020-11-21 DIAGNOSIS — J309 Allergic rhinitis, unspecified: Secondary | ICD-10-CM | POA: Diagnosis not present

## 2020-11-21 DIAGNOSIS — E041 Nontoxic single thyroid nodule: Secondary | ICD-10-CM | POA: Diagnosis not present

## 2020-11-28 ENCOUNTER — Ambulatory Visit (INDEPENDENT_AMBULATORY_CARE_PROVIDER_SITE_OTHER): Payer: BC Managed Care – PPO

## 2020-11-28 DIAGNOSIS — J309 Allergic rhinitis, unspecified: Secondary | ICD-10-CM

## 2020-11-28 DIAGNOSIS — Z1339 Encounter for screening examination for other mental health and behavioral disorders: Secondary | ICD-10-CM | POA: Diagnosis not present

## 2020-11-28 DIAGNOSIS — Z1331 Encounter for screening for depression: Secondary | ICD-10-CM | POA: Diagnosis not present

## 2020-11-28 DIAGNOSIS — R82998 Other abnormal findings in urine: Secondary | ICD-10-CM | POA: Diagnosis not present

## 2020-11-28 DIAGNOSIS — Z0000 Encounter for general adult medical examination without abnormal findings: Secondary | ICD-10-CM | POA: Diagnosis not present

## 2020-11-28 DIAGNOSIS — R5383 Other fatigue: Secondary | ICD-10-CM | POA: Diagnosis not present

## 2020-11-28 DIAGNOSIS — Z1212 Encounter for screening for malignant neoplasm of rectum: Secondary | ICD-10-CM | POA: Diagnosis not present

## 2020-11-30 DIAGNOSIS — Z1212 Encounter for screening for malignant neoplasm of rectum: Secondary | ICD-10-CM | POA: Diagnosis not present

## 2020-12-05 ENCOUNTER — Ambulatory Visit (INDEPENDENT_AMBULATORY_CARE_PROVIDER_SITE_OTHER): Payer: BC Managed Care – PPO

## 2020-12-05 DIAGNOSIS — J309 Allergic rhinitis, unspecified: Secondary | ICD-10-CM

## 2020-12-07 DIAGNOSIS — M170 Bilateral primary osteoarthritis of knee: Secondary | ICD-10-CM | POA: Diagnosis not present

## 2020-12-24 DIAGNOSIS — Z85828 Personal history of other malignant neoplasm of skin: Secondary | ICD-10-CM | POA: Diagnosis not present

## 2020-12-24 DIAGNOSIS — L570 Actinic keratosis: Secondary | ICD-10-CM | POA: Diagnosis not present

## 2020-12-24 DIAGNOSIS — L821 Other seborrheic keratosis: Secondary | ICD-10-CM | POA: Diagnosis not present

## 2020-12-24 DIAGNOSIS — L814 Other melanin hyperpigmentation: Secondary | ICD-10-CM | POA: Diagnosis not present

## 2020-12-24 DIAGNOSIS — C44619 Basal cell carcinoma of skin of left upper limb, including shoulder: Secondary | ICD-10-CM | POA: Diagnosis not present

## 2020-12-24 DIAGNOSIS — D1801 Hemangioma of skin and subcutaneous tissue: Secondary | ICD-10-CM | POA: Diagnosis not present

## 2020-12-26 ENCOUNTER — Ambulatory Visit (INDEPENDENT_AMBULATORY_CARE_PROVIDER_SITE_OTHER): Payer: BC Managed Care – PPO

## 2020-12-26 DIAGNOSIS — J309 Allergic rhinitis, unspecified: Secondary | ICD-10-CM

## 2021-01-30 ENCOUNTER — Ambulatory Visit: Payer: BC Managed Care – PPO | Admitting: Internal Medicine

## 2021-01-30 ENCOUNTER — Encounter: Payer: Self-pay | Admitting: Internal Medicine

## 2021-01-30 VITALS — BP 110/82 | HR 76 | Ht 74.0 in | Wt 202.0 lb

## 2021-01-30 DIAGNOSIS — R195 Other fecal abnormalities: Secondary | ICD-10-CM | POA: Diagnosis not present

## 2021-01-30 NOTE — Patient Instructions (Signed)
If you are age 59 or older, your body mass index should be between 23-30. Your Body mass index is 25.94 kg/m. If this is out of the aforementioned range listed, please consider follow up with your Primary Care Provider.  If you are age 63 or younger, your body mass index should be between 19-25. Your Body mass index is 25.94 kg/m. If this is out of the aformentioned range listed, please consider follow up with your Primary Care Provider.   ________________________________________________________  The [facility] providers would like to encourage you to use [facility] to communicate with providers for non-urgent requests or questions.  Due to long hold times on the telephone, sending your provider a message by [facility] may be a faster and more efficient way to get a response.  Please allow 48 business hours for a response.  Please remember that this is for non-urgent requests.  _______________________________________________________  I will call you to schedule your colonoscopy

## 2021-01-30 NOTE — Progress Notes (Signed)
HISTORY OF PRESENT ILLNESS:  Dalton Lopez is a 61 y.o. male, owner of [employer], who was sent today by Dr. Reynaldo Minium regarding Hemoccult positive stool.  I saw the patient initially February 2020 when he was self-referred regarding abrupt change in bowel habits.  He subsequently underwent complete colonoscopy August 13, 2018.  The examination was normal.  Random colon biopsies were normal.  He was placed on fiber.  His bowel habits improved.  As part of his routine annual examination he submitted Hemoccult studies.  I have reviewed outside office note as well as laboratories.  Hemoccult studies returned positive on 2 separate occasions (November 28, 2020 and November 30, 2020.  Review of blood work from November 2022 revealed normal hemoglobin of 14.7.  This compares favorably to 1 year previous hemoglobin of 13.5.  Comprehensive metabolic panel is normal.  Patient's GI review of systems is entirely negative.  No melena or hematochezia.  He is having 1 regular bowel movement per day.  He does take meloxicam daily for arthritis.  He is not on a PPI.  REVIEW OF SYSTEMS:  All non-GI ROS negative unless otherwise stated in the HPI except for arthritis, anxiety, sinus and allergy  Past Medical History:  Diagnosis Date   Actinic keratosis    Allergy    seasonal   Anxiety    Arthritis    bil knees, back   Change in bowel habits    Constipation    DDD (degenerative disc disease), lumbar    History of kidney stones    History of moderate sun exposure    Post-operative nausea and vomiting    Skin cancer    basal cell , squamous cell   Thyroid nodule    benign    Past Surgical History:  Procedure Laterality Date   ACHILLES TENDON REPAIR     right    FOOT SURGERY Left 2011   joint fusion repair with screws left foot   KNEE ARTHROSCOPY Bilateral 11/2011   Bil   MEDIAL PARTIAL KNEE REPLACEMENT Left    MOHS SURGERY     multiple   SKIN BIOPSY  11/17/13   forehead, left  temple, central nose tip    Social History [name] Lopez  reports that he has never smoked. He has never used smokeless tobacco. He reports current alcohol use of about 3.0 standard drinks per week. He reports that he does not use drugs.  family history includes Allergic rhinitis in his daughter and father; Asthma in his father; Ovarian cancer in his paternal grandmother; Skin cancer in his father and paternal grandmother.  No Known Allergies     PHYSICAL EXAMINATION: Vital signs: BP 110/82    Pulse 76    Ht 6\' 2"  (1.88 m)    Wt 202 lb (91.6 kg)    SpO2 97%    BMI 25.94 kg/m   Constitutional: generally well-appearing, no acute distress Psychiatric: alert and oriented x3, cooperative Eyes: extraocular movements intact, anicteric, conjunctiva pink Mouth: Mask Neck: supple no lymphadenopathy Cardiovascular: heart regular rate and rhythm, no murmur Lungs: clear to auscultation bilaterally Abdomen: soft, nontender, nondistended, no obvious ascites, no peritoneal signs, normal bowel sounds, no organomegaly Rectal: Deferred until colonoscopy Extremities: no clubbing, cyanosis, or lower extremity edema bilaterally Skin: no lesions on visible extremities Neuro: No focal deficits.  Cranial nerves intact  ASSESSMENT:  1.  Asymptomatic Hemoccult positive stool with normal hemoglobin.  Rule out occult GI mucosal lesion 2.  Colonoscopy July 2020.  Normal   PLAN:  1.  Colonoscopy to evaluate Hemoccult-positive stool.The nature of the procedure, as well as the risks, benefits, and alternatives were carefully and thoroughly reviewed with the patient. Ample time for discussion and questions allowed. The patient understood, was satisfied, and agreed to proceed.  2.  Upper endoscopy to evaluate Hemoccult-positive stool in the gentleman on chronic NSAIDs.The nature of the procedure, as well as the risks, benefits, and alternatives were carefully and thoroughly reviewed with the patient. Ample time  for discussion and questions allowed. The patient understood, was satisfied, and agreed to proceed.

## 2021-01-31 DIAGNOSIS — N200 Calculus of kidney: Secondary | ICD-10-CM | POA: Diagnosis not present

## 2021-01-31 DIAGNOSIS — R3915 Urgency of urination: Secondary | ICD-10-CM | POA: Diagnosis not present

## 2021-01-31 DIAGNOSIS — N401 Enlarged prostate with lower urinary tract symptoms: Secondary | ICD-10-CM | POA: Diagnosis not present

## 2021-02-04 ENCOUNTER — Other Ambulatory Visit: Payer: Self-pay | Admitting: Urology

## 2021-02-07 NOTE — Progress Notes (Signed)
Patient to arrive at [facility] on 02/11/2021. History and medications reviewed. Pre-procedure instructions given. Patient has stopped all supplements on 02/05/2018. Instructed to discontinue mobic. NPO after MN Sunday except for clear liquids until 0445. Driver secured.

## 2021-02-10 NOTE — H&P (Signed)
F/u-    1) kidney stones - CT Jul 2017 revealed an 8 mm RLP stone, smaller scattered LUP and LLP stones. Visible on scout and similar to KUB from 2015. KUB 2019 stable 7-9 RLP stone. He stopped K citrate.   KUB today Jan 2023 with slightly larger RLP stone to 9 mm.    2) BPH - September 2016 PSA 0.93. PSA was 1.16 Oct 2016, 1.41 in 2021 and 1.19 in 2022. He's noticed more weak stream and nocturia. Beer especially worse. He has a good flow. No h/o sleep apnea but his wife says he snores. No caridac issues. He has allergies and takes Allegra. AUASS = 10. He was prescribed daily tadalafil October 2022. Urgency better.    Today, seen for the above. No flank pain or stone passage.    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     ALLERGIES: No Allergies Pollen    MEDICATIONS: Claritin  Fish Oil  Meloxicam 15 mg tablet  Multi-Day Vitamins  Paxil 20 mg tablet  Tadalafil 5 mg tablet 1 tablet PO Daily PRN     GU PSH: ESWL - 2015 Locm 300-399Mg /Ml Iodine,1Ml - 2017       PSH Notes: Lithotripsy, Liposuction, Arthroscopy Knee Left, Arthroscopy Knee Right, Primary Repair Of Ruptured Achilles Tendon, Tonsillectomy, mohs   NON-GU PSH: Knee Arthroscopy/surgery, Left Knee Arthroscopy; Dx - 2009, 2009 Knee replacement Remove Tonsils - 2009 Repair Achilles Tendon - 2009     GU PMH: BPH w/LUTS, We discussed the nature r/b of surveillance, alpha blocker, 5ari, daily pdei, OAB meds and procedures such as TURP, LVP, LEP in the OR or PUL or WVT in the office. He will try daily tadalafil but can take as needed. - 10/31/2020, - 2017 Nocturia - 10/31/2020 Renal calculus, f/u with aKUB - 10/31/2020, - 2019, - 2017, Kidney stone on right side, - 2015 Weak Urinary Stream - 2019 Microscopic hematuria - 2017, - 2017 Ureteral calculus, Calculus of left ureter - 2015 Abdominal Pain Unspec, Left flank pain - 2015 Other microscopic hematuria, Microscopic hematuria - 2015      PMH Notes:  2011-06-11 14:33:25  - Note: Arthritis   NON-GU PMH: Encounter for general adult medical examination without abnormal findings, Encounter for preventive health examination - 2015 Nausea, Nausea - 2015 Anxiety    FAMILY HISTORY: 1 Daughter - Other 1 son - Other Family Health Status Number - Runs In Family nephrolithiasis - Father   SOCIAL HISTORY: Marital Status: Married Preferred Language: English; Ethnicity: Not Hispanic Or Latino; Race: White Current Smoking Status: Patient has never smoked.  Social Drinker.  Drinks 3 caffeinated drinks per day.     Notes: Previous History Of Smoking, Caffeine Use, Marital History - Currently Married, Occupation:, Alcohol Use   REVIEW OF SYSTEMS:    GU Review Male:   Patient denies frequent urination, hard to postpone urination, burning/ pain with urination, get up at night to urinate, leakage of urine, stream starts and stops, trouble starting your stream, have to strain to urinate , erection problems, and penile pain.  Gastrointestinal (Upper):   Patient denies nausea, vomiting, and indigestion/ heartburn.  Gastrointestinal (Lower):   Patient denies diarrhea and constipation.  Constitutional:   Patient denies fever, night sweats, weight loss, and fatigue.  Skin:   Patient denies skin rash/ lesion and itching.  Eyes:   Patient denies double vision and blurred vision.  Ears/ Nose/ Throat:   Patient denies sore throat and sinus problems.  Hematologic/Lymphatic:   Patient  denies swollen glands and easy bruising.  Cardiovascular:   Patient denies leg swelling and chest pains.  Respiratory:   Patient denies cough and shortness of breath.  Endocrine:   Patient denies excessive thirst.  Musculoskeletal:   Patient denies back pain and joint pain.  Neurological:   Patient denies headaches and dizziness.  Psychologic:   Patient denies depression and anxiety.   VITAL SIGNS: None   MULTI-SYSTEM PHYSICAL EXAMINATION:    Constitutional: Well-nourished. No physical  deformities. Normally developed. Good grooming.  Neck: Neck symmetrical, not swollen. Normal tracheal position.  Respiratory: No labored breathing, no use of accessory muscles.   Cardiovascular: Normal temperature, normal extremity pulses, no swelling, no varicosities.  Skin: No paleness, no jaundice, no cyanosis. No lesion, no ulcer, no rash.  Neurologic / Psychiatric: Oriented to time, oriented to place, oriented to person. No depression, no anxiety, no agitation.  Gastrointestinal: No mass, no tenderness, no rigidity, non obese abdomen.     Complexity of Data:  Lab Test Review:   PSA  Records Review:   POC Tool  X-Ray Review: KUB: Reviewed Films. Discussed With Patient. 2019  C.T. Abdomen/Pelvis: Reviewed Films. 2017    PROCEDURES:         KUB - 74018  A single view of the abdomen is obtained.  Calculi:  RLP stone slightly larger - to 9 mm       The bones appeared normal. The bowel gas pattern appeared normal. The soft tissues were unremarkable. . Patient confirmed No Neulasta OnPro Device.           Urinalysis w/Scope Dipstick Dipstick Cont'd Micro  Color: Yellow Bilirubin: Neg mg/dL WBC/hpf: 0 - 5/hpf  Appearance: Clear Ketones: Trace mg/dL RBC/hpf: NS (Not Seen)  Specific Gravity: 1.015 Blood: Neg ery/uL Bacteria: Few (10-25/hpf)  pH: 6.0 Protein: Neg mg/dL Cystals: NS (Not Seen)  Glucose: Neg mg/dL Urobilinogen: 0.2 mg/dL Casts: NS (Not Seen)    Nitrites: Neg Trichomonas: Not Present    Leukocyte Esterase: 2+ leu/uL Mucous: Not Present      Epithelial Cells: NS (Not Seen)      Yeast: NS (Not Seen)      Sperm: Not Present    Notes: QNS for spun micro    ASSESSMENT:      ICD-10 Details  1 GU:   BPH w/LUTS - N40.1 Chronic, Stable - cont daily pde5i   2   Urinary Urgency - R39.15 Chronic, Stable  3   Renal calculus - N20.0 Chronic, Stable - I discussed with the patient the nature risks and benefits of continued stone passage, off label use of alpha blockers,  shockwave lithotripsy or ureteroscopy. All questions answered. He will proceed with ESWL. Disc one of my colleagues will likely perform the procedure.    PLAN:           Schedule Return Visit/Planned Activity: 1 Year - Office Visit, Follow up MD  Return Visit/Planned Activity: Next Available Appointment - Schedule Surgery          Document Letter(s):  Created for Patient: Clinical Summary

## 2021-02-11 ENCOUNTER — Encounter (HOSPITAL_BASED_OUTPATIENT_CLINIC_OR_DEPARTMENT_OTHER)
Admission: RE | Disposition: A | Discharge status: Home / Self Care | Payer: Self-pay | Source: Home / Self Care | Attending: Urology

## 2021-02-11 ENCOUNTER — Ambulatory Visit (HOSPITAL_BASED_OUTPATIENT_CLINIC_OR_DEPARTMENT_OTHER)
Admission: RE | Admit: 2021-02-11 | Discharge: 2021-02-11 | Disposition: A | Discharge status: Home / Self Care | Payer: BC Managed Care – PPO | Attending: Urology | Admitting: Urology

## 2021-02-11 ENCOUNTER — Encounter (HOSPITAL_BASED_OUTPATIENT_CLINIC_OR_DEPARTMENT_OTHER): Payer: Self-pay | Admitting: Urology

## 2021-02-11 ENCOUNTER — Ambulatory Visit (HOSPITAL_COMMUNITY): Payer: BC Managed Care – PPO

## 2021-02-11 ENCOUNTER — Other Ambulatory Visit: Payer: Self-pay

## 2021-02-11 DIAGNOSIS — Z01818 Encounter for other preprocedural examination: Secondary | ICD-10-CM | POA: Diagnosis not present

## 2021-02-11 DIAGNOSIS — N401 Enlarged prostate with lower urinary tract symptoms: Secondary | ICD-10-CM | POA: Diagnosis not present

## 2021-02-11 DIAGNOSIS — N200 Calculus of kidney: Secondary | ICD-10-CM | POA: Diagnosis not present

## 2021-02-11 DIAGNOSIS — R351 Nocturia: Secondary | ICD-10-CM | POA: Insufficient documentation

## 2021-02-11 DIAGNOSIS — R3915 Urgency of urination: Secondary | ICD-10-CM | POA: Insufficient documentation

## 2021-02-11 DIAGNOSIS — R3912 Poor urinary stream: Secondary | ICD-10-CM | POA: Insufficient documentation

## 2021-02-11 DIAGNOSIS — Z791 Long term (current) use of non-steroidal anti-inflammatories (NSAID): Secondary | ICD-10-CM | POA: Insufficient documentation

## 2021-02-11 HISTORY — PX: EXTRACORPOREAL SHOCK WAVE LITHOTRIPSY: SHX1557

## 2021-02-11 SURGERY — LITHOTRIPSY, ESWL
Anesthesia: LOCAL | Laterality: Right

## 2021-02-11 MED ORDER — ONDANSETRON HCL 4 MG PO TABS: 4.0000 mg | ORAL_TABLET | Freq: Three times a day (TID) | ORAL | 1 refills | Status: DC | PRN

## 2021-02-11 MED ORDER — DIAZEPAM 5 MG PO TABS
10.0000 mg | ORAL_TABLET | ORAL | Status: AC
  Administered 2021-02-11: 10 mg via ORAL

## 2021-02-11 MED ORDER — SODIUM CHLORIDE 0.9 % IV SOLN: INTRAVENOUS | Status: DC

## 2021-02-11 MED ORDER — DIPHENHYDRAMINE HCL 25 MG PO CAPS
25.0000 mg | ORAL_CAPSULE | ORAL | Status: AC
  Administered 2021-02-11: 25 mg via ORAL

## 2021-02-11 MED ORDER — CIPROFLOXACIN HCL 500 MG PO TABS
500.0000 mg | ORAL_TABLET | ORAL | Status: AC
  Administered 2021-02-11: 500 mg via ORAL

## 2021-02-11 MED ORDER — HYDROCODONE-ACETAMINOPHEN 5-325 MG PO TABS: 2.0000 | ORAL_TABLET | Freq: Four times a day (QID) | ORAL | 0 refills | Status: DC | PRN

## 2021-02-11 MED ORDER — CIPROFLOXACIN HCL 500 MG PO TABS
ORAL_TABLET | ORAL | Status: AC
  Filled 2021-02-11: qty 1

## 2021-02-11 MED ORDER — DIAZEPAM 5 MG PO TABS
ORAL_TABLET | ORAL | Status: AC
  Filled 2021-02-11: qty 2

## 2021-02-11 MED ORDER — DIPHENHYDRAMINE HCL 25 MG PO CAPS
ORAL_CAPSULE | ORAL | Status: AC
  Filled 2021-02-11: qty 1

## 2021-02-11 MED ORDER — SODIUM CHLORIDE 0.9% FLUSH: 3.0000 mL | Freq: Two times a day (BID) | INTRAVENOUS | Status: DC

## 2021-02-11 NOTE — Interval H&P Note (Signed)
History and Physical Interval Note: No change  02/11/2021 8:40 AM  Dalton Lopez  has presented today for surgery, with the diagnosis of RIGHT RENAL STONE.  The various methods of treatment have been discussed with the patient and family. After consideration of risks, benefits and other options for treatment, the patient has consented to  Procedure(s): EXTRACORPOREAL SHOCK WAVE LITHOTRIPSY (ESWL) (Right) as a surgical intervention.  The patient's history has been reviewed, patient examined, no change in status, stable for surgery.  I have reviewed the patient's chart and labs.  Questions were answered to the patient's satisfaction.     Irine Seal

## 2021-02-11 NOTE — Op Note (Signed)
See [facility] OP note

## 2021-02-12 ENCOUNTER — Encounter (HOSPITAL_BASED_OUTPATIENT_CLINIC_OR_DEPARTMENT_OTHER): Payer: Self-pay | Admitting: Urology

## 2021-02-26 ENCOUNTER — Telehealth: Payer: Self-pay

## 2021-02-26 DIAGNOSIS — R195 Other fecal abnormalities: Secondary | ICD-10-CM

## 2021-02-26 MED ORDER — SUTAB 1479-225-188 MG PO TABS: 1.0000 | ORAL_TABLET | Freq: Once | ORAL | 0 refills | Status: AC

## 2021-02-26 NOTE — Telephone Encounter (Signed)
Mailed instructions for ECL to patient; put in ambulatory referral and sent [place].

## 2021-02-28 DIAGNOSIS — M170 Bilateral primary osteoarthritis of knee: Secondary | ICD-10-CM | POA: Diagnosis not present

## 2021-03-20 ENCOUNTER — Telehealth: Payer: Self-pay

## 2021-03-20 ENCOUNTER — Ambulatory Visit: Payer: Self-pay

## 2021-03-20 NOTE — Telephone Encounter (Signed)
Patient came in today to get allergy injections but his last injection was 12/26/20 and he received red .3 schedule C q4wks @ .5. ?Where should we restart him? ?

## 2021-03-21 DIAGNOSIS — N200 Calculus of kidney: Secondary | ICD-10-CM | POA: Diagnosis not present

## 2021-03-21 NOTE — Telephone Encounter (Signed)
Informed patient he can come in and continue with is immunotherapy injections. We're repeating his last dose and building up from there per Dr. Neldon Mc. ? ?

## 2021-03-25 ENCOUNTER — Ambulatory Visit (INDEPENDENT_AMBULATORY_CARE_PROVIDER_SITE_OTHER): Payer: BC Managed Care – PPO | Admitting: *Deleted

## 2021-03-25 DIAGNOSIS — J309 Allergic rhinitis, unspecified: Secondary | ICD-10-CM

## 2021-03-28 DIAGNOSIS — R3915 Urgency of urination: Secondary | ICD-10-CM | POA: Diagnosis not present

## 2021-03-28 DIAGNOSIS — N401 Enlarged prostate with lower urinary tract symptoms: Secondary | ICD-10-CM | POA: Diagnosis not present

## 2021-03-28 DIAGNOSIS — E349 Endocrine disorder, unspecified: Secondary | ICD-10-CM | POA: Diagnosis not present

## 2021-03-28 DIAGNOSIS — R6882 Decreased libido: Secondary | ICD-10-CM | POA: Diagnosis not present

## 2021-04-11 ENCOUNTER — Ambulatory Visit (INDEPENDENT_AMBULATORY_CARE_PROVIDER_SITE_OTHER): Payer: BC Managed Care – PPO

## 2021-04-11 DIAGNOSIS — J309 Allergic rhinitis, unspecified: Secondary | ICD-10-CM

## 2021-04-12 DIAGNOSIS — M1711 Unilateral primary osteoarthritis, right knee: Secondary | ICD-10-CM | POA: Diagnosis not present

## 2021-04-12 DIAGNOSIS — Z96652 Presence of left artificial knee joint: Secondary | ICD-10-CM | POA: Diagnosis not present

## 2021-04-15 ENCOUNTER — Encounter: Payer: Self-pay | Admitting: Internal Medicine

## 2021-04-18 ENCOUNTER — Encounter: Payer: Self-pay | Admitting: Internal Medicine

## 2021-04-18 ENCOUNTER — Ambulatory Visit (AMBULATORY_SURGERY_CENTER): Payer: BC Managed Care – PPO | Admitting: Internal Medicine

## 2021-04-18 VITALS — BP 126/83 | HR 68 | Temp 97.8°F | Resp 16 | Ht 74.0 in | Wt 202.0 lb

## 2021-04-18 DIAGNOSIS — D122 Benign neoplasm of ascending colon: Secondary | ICD-10-CM

## 2021-04-18 DIAGNOSIS — K2970 Gastritis, unspecified, without bleeding: Secondary | ICD-10-CM

## 2021-04-18 DIAGNOSIS — Z1211 Encounter for screening for malignant neoplasm of colon: Secondary | ICD-10-CM | POA: Diagnosis not present

## 2021-04-18 DIAGNOSIS — R195 Other fecal abnormalities: Secondary | ICD-10-CM | POA: Diagnosis not present

## 2021-04-18 MED ORDER — SODIUM CHLORIDE 0.9 % IV SOLN: 500.0000 mL | Freq: Once | INTRAVENOUS | Status: DC

## 2021-04-18 NOTE — Op Note (Signed)
[facility] ?Patient Name: Dalton Lopez ?Procedure Date: 04/18/2021 11:11 AM ?MRN: [id number] ?Endoscopist: Docia Chuck. Henrene Pastor , MD ?Age: 61 ?Referring MD:  ?Date of Birth: March 06, 1960 ?Gender: Male ?Account #: [id number] ?Procedure:                Colonoscopy with cold snare polypectomy x 1 ?Indications:              Heme positive stool (asymptomatic, normal  ?                          hemoglobin). Previous examinations 2014 and 2020  ?                          were normal ?Medicines:                Monitored Anesthesia Care ?Procedure:                Pre-Anesthesia Assessment: ?                          - Prior to the procedure, a History and Physical  ?                          was performed, and patient medications and  ?                          allergies were reviewed. The patient's tolerance of  ?                          previous anesthesia was also reviewed. The risks  ?                          and benefits of the procedure and the sedation  ?                          options and risks were discussed with the patient.  ?                          All questions were answered, and informed consent  ?                          was obtained. Prior Anticoagulants: The patient has  ?                          taken no previous anticoagulant or antiplatelet  ?                          agents. ASA Grade Assessment: II - A patient with  ?                          mild systemic disease. After reviewing the risks  ?                          and benefits, the patient was deemed in  ?  satisfactory condition to undergo the procedure. ?                          After obtaining informed consent, the colonoscope  ?                          was passed under direct vision. Throughout the  ?                          procedure, the patient's blood pressure, pulse, and  ?                          oxygen saturations were monitored continuously. The  ?                          Olympus CF-HQ190L ([id number])  Colonoscope was  ?                          introduced through the anus and advanced to the the  ?                          cecum, identified by appendiceal orifice and  ?                          ileocecal valve. The ileocecal valve, appendiceal  ?                          orifice, and rectum were photographed. The quality  ?                          of the bowel preparation was excellent. The  ?                          colonoscopy was performed without difficulty. The  ?                          patient tolerated the procedure well. The bowel  ?                          preparation used was SUPREP via split dose  ?                          instruction. ?Scope In: 11:30:05 AM ?Scope Out: 11:55:06 AM ?Scope Withdrawal Time: 0 hours 15 minutes 22 seconds  ?Total Procedure Duration: 0 hours 25 minutes 1 second  ?Findings:                 A 3 mm polyp was found in the ascending colon. The  ?                          polyp was removed with a cold snare. Resection and  ?                          retrieval were complete. ?  The exam was otherwise without abnormality on  ?                          direct and retroflexion views. The colon was  ?                          REDUNDANT. ?Complications:            No immediate complications. Estimated blood loss:  ?                          None. ?Estimated Blood Loss:     Estimated blood loss: none. ?Impression:               - One 3 mm polyp in the ascending colon, removed  ?                          with a cold snare. Resected and retrieved. ?                          - The examination was otherwise normal on direct  ?                          and retroflexion views. ?                          - REDUNDANT COLON ?Recommendation:           - Repeat colonoscopy in 10 years for surveillance. ?                          - Patient has a contact number available for  ?                          emergencies. The signs and symptoms of potential  ?                           delayed complications were discussed with the  ?                          patient. Return to normal activities tomorrow.  ?                          Written discharge instructions were provided to the  ?                          patient. ?                          - Resume previous diet. ?                          - Continue present medications. ?                          - Await pathology results. ?Docia Chuck. Henrene Pastor, MD ?04/18/2021 12:00:54 PM ?This report has been signed electronically. ?

## 2021-04-18 NOTE — Patient Instructions (Addendum)
Omeprazole OTC 20 mg 1 tab daily x 2 weeks ? ? ? ?YOU HAD AN ENDOSCOPIC PROCEDURE TODAY: Refer to the procedure report and other information in the discharge instructions given to you for any specific questions about what was found during the examination. If this information does not answer your questions, please call [facility] office at [phone] to clarify.  ? ?YOU SHOULD EXPECT: Some feelings of bloating in the abdomen. Passage of more gas than usual. Walking can help get rid of the air that was put into your GI tract during the procedure and reduce the bloating. If you had a lower endoscopy (such as a colonoscopy or flexible sigmoidoscopy) you may notice spotting of blood in your stool or on the toilet paper. Some abdominal soreness may be present for a day or two, also. ? ?DIET: Your first meal following the procedure should be a light meal and then it is ok to progress to your normal diet. A half-sandwich or bowl of soup is an example of a good first meal. Heavy or fried foods are harder to digest and may make you feel nauseous or bloated. Drink plenty of fluids but you should avoid alcoholic beverages for 24 hours. If you had a esophageal dilation, please see attached instructions for diet.   ? ?ACTIVITY: Your care partner should take you home directly after the procedure. You should plan to take it easy, moving slowly for the rest of the day. You can resume normal activity the day after the procedure however YOU SHOULD NOT DRIVE, use power tools, machinery or perform tasks that involve climbing or major physical exertion for 24 hours (because of the sedation medicines used during the test).  ? ?SYMPTOMS TO REPORT IMMEDIATELY: ?A gastroenterologist can be reached at any hour. Please call [phone]  for any of the following symptoms:  ?Following lower endoscopy (colonoscopy, flexible sigmoidoscopy) ?Excessive amounts of blood in the stool  ?Significant tenderness, worsening of abdominal pains  ?Swelling of  the abdomen that is new, acute  ?Fever of 100? or higher  ?Following upper endoscopy (EGD, EUS, ERCP, esophageal dilation) ?Vomiting of blood or coffee ground material  ?New, significant abdominal pain  ?New, significant chest pain or pain under the shoulder blades  ?Painful or persistently difficult swallowing  ?New shortness of breath  ?Black, tarry-looking or red, bloody stools ? ?FOLLOW UP:  ?If any biopsies were taken you will be contacted by phone or by letter within the next 1-3 weeks. Call [phone]  if you have not heard about the biopsies in 3 weeks.  ?Please also call with any specific questions about appointments or follow up tests.  ?

## 2021-04-18 NOTE — Progress Notes (Signed)
Called to room to assist during endoscopic procedure.  Patient ID and intended procedure confirmed with present staff. Received instructions for my participation in the procedure from the performing physician.  

## 2021-04-18 NOTE — Op Note (Signed)
[place] ?Patient Name: Dalton Lopez ?Procedure Date: 04/18/2021 11:10 AM ?MRN: [id number] ?Endoscopist: Docia Chuck. Henrene Pastor , MD ?Age: 61 ?Referring MD:  ?Date of Birth: [birth year] ?Gender: Male ?Account #: [id number] ?Procedure:                Upper GI endoscopy with biopsies ?Indications:              Heme positive stool (asymptomatic, normal  ?                          hemoglobin). Completed colonoscopy without  ?                          significant abnormalities. Takes NSAIDs ?Medicines:                Monitored Anesthesia Care ?Procedure:                Pre-Anesthesia Assessment: ?                          - Prior to the procedure, a History and Physical  ?                          was performed, and patient medications and  ?                          allergies were reviewed. The patient's tolerance of  ?                          previous anesthesia was also reviewed. The risks  ?                          and benefits of the procedure and the sedation  ?                          options and risks were discussed with the patient.  ?                          All questions were answered, and informed consent  ?                          was obtained. Prior Anticoagulants: The patient has  ?                          taken no previous anticoagulant or antiplatelet  ?                          agents. ASA Grade Assessment: II - A patient with  ?                          mild systemic disease. After reviewing the risks  ?                          and benefits, the patient was deemed in  ?  satisfactory condition to undergo the procedure. ?                          After obtaining informed consent, the endoscope was  ?                          passed under direct vision. Throughout the  ?                          procedure, the patient's blood pressure, pulse, and  ?                          oxygen saturations were monitored continuously. The  ?                          GIF [id number] #[id number]  was introduced through the  ?                          mouth, and advanced to the second part of duodenum.  ?                          The upper GI endoscopy was accomplished without  ?                          difficulty. The patient tolerated the procedure  ?                          well. ?Scope In: ?Scope Out: ?Findings:                 The esophagus was normal. ?                          The stomach revealed superficial gastric erosions  ?                          in the antrum. Biopsies were taken with a cold  ?                          forceps for histology. ?                          The examined duodenum was normal. ?                          The cardia and gastric fundus were normal on  ?                          retroflexion. ?Complications:            No immediate complications. ?Estimated Blood Loss:     Estimated blood loss: none. ?Impression:               1. Gastritis, possibly due to NSAIDs. ?                          2. Otherwise normal EGD. ?Recommendation:           -  Patient has a contact number available for  ?                          emergencies. The signs and symptoms of potential  ?                          delayed complications were discussed with the  ?                          patient. Return to normal activities tomorrow.  ?                          Written discharge instructions were provided to the  ?                          patient. ?                          - Resume previous diet. ?                          - Continue present medications. ?                          - Await pathology results. ?                          - OBTAIN OMEPRAZOLE (Prilosec) OTC 20 mg at your  ?                          pharmacy. Take 1 daily for 2 weeks. Thereafter, if  ?                          you need to take anti-inflammatories such as  ?                          ibuprofen or Aleve, then I would recommend that you  ?                          could administer a dose of Prilosec at the same  ?                           time. This will reduce the risk of ulcer formation. ?                          - Return to the care of your primary provider ?                          - You should not do annual stool studies as part of  ?                          your physical exam, as you are in a colonoscopy  ?  surveillance program ?Docia Chuck. Henrene Pastor, MD ?04/18/2021 12:11:18 PM ?This report has been signed electronically. ?

## 2021-04-18 NOTE — Progress Notes (Signed)
Sedate, gd SR, tolerated procedure well, VSS, report to RN 

## 2021-04-18 NOTE — Progress Notes (Signed)
HISTORY OF PRESENT ILLNESS: ?  ?Dalton Lopez is a 61 y.o. male, owner of [employer], who was sent today by Dr. Reynaldo Minium regarding Hemoccult positive stool.  I saw the patient initially February 2020 when he was self-referred regarding abrupt change in bowel habits.  He subsequently underwent complete colonoscopy August 13, 2018.  The examination was normal.  Random colon biopsies were normal.  He was placed on fiber.  His bowel habits improved.  As part of his routine annual examination he submitted Hemoccult studies.  I have reviewed outside office note as well as laboratories.  Hemoccult studies returned positive on 2 separate occasions (November 28, 2020 and November 30, 2020.  Review of blood work from November 2022 revealed normal hemoglobin of 14.7.  This compares favorably to 1 year previous hemoglobin of 13.5.  Comprehensive metabolic panel is normal.  Patient's GI review of systems is entirely negative.  No melena or hematochezia.  He is having 1 regular bowel movement per day.  He does take meloxicam daily for arthritis.  He is not on a PPI. ?  ?REVIEW OF SYSTEMS: ?  ?All non-GI ROS negative unless otherwise stated in the HPI except for arthritis, anxiety, sinus and allergy ?  ?    ?Past Medical History:  ?Diagnosis Date  ? Actinic keratosis    ? Allergy    ?  seasonal  ? Anxiety    ? Arthritis    ?  bil knees, back  ? Change in bowel habits    ? Constipation    ? DDD (degenerative disc disease), lumbar    ? History of kidney stones    ? History of moderate sun exposure    ? Post-operative nausea and vomiting    ? Skin cancer    ?  basal cell , squamous cell  ? Thyroid nodule    ?  benign  ?  ?  ?     ?Past Surgical History:  ?Procedure Laterality Date  ? ACHILLES TENDON REPAIR      ?  right   ? FOOT SURGERY Left 2011  ?  joint fusion repair with screws left foot  ? KNEE ARTHROSCOPY Bilateral 11/2011  ?  Bil  ? MEDIAL PARTIAL KNEE REPLACEMENT Left    ? MOHS SURGERY      ?  multiple   ? SKIN BIOPSY   11/17/13  ?  forehead, left temple, central nose tip  ?  ?  ?Social History ?Dalton Lopez  reports that he has never smoked. He has never used smokeless tobacco. He reports current alcohol use of about 3.0 standard drinks per week. He reports that he does not use drugs. ?  ?family history includes Allergic rhinitis in his daughter and father; Asthma in his father; Ovarian cancer in his paternal grandmother; Skin cancer in his father and paternal grandmother. ?  ?No Known Allergies ?  ?  ?  ?PHYSICAL EXAMINATION: ?Vital signs: BP 110/82   Pulse 76   Ht '6\' 2"'$  (1.88 m)   Wt 202 lb (91.6 kg)   SpO2 97%   BMI 25.94 kg/m?   ?Constitutional: generally well-appearing, no acute distress ?Psychiatric: alert and oriented x3, cooperative ?Eyes: extraocular movements intact, anicteric, conjunctiva pink ?Mouth: Mask ?Neck: supple no lymphadenopathy ?Cardiovascular: heart regular rate and rhythm, no murmur ?Lungs: clear to auscultation bilaterally ?Abdomen: soft, nontender, nondistended, no obvious ascites, no peritoneal signs, normal bowel sounds, no organomegaly ?Rectal: Deferred until colonoscopy ?Extremities:  no clubbing, cyanosis, or lower extremity edema bilaterally ?Skin: no lesions on visible extremities ?Neuro: No focal deficits.  Cranial nerves intact ?  ?ASSESSMENT: ?  ?1.  Asymptomatic Hemoccult positive stool with normal hemoglobin.  Rule out occult GI mucosal lesion ?2.  Colonoscopy July 2020.  Normal ?  ?  ?PLAN: ?  ?1.  Colonoscopy to evaluate Hemoccult-positive stool.The nature of the procedure, as well as the risks, benefits, and alternatives were carefully and thoroughly reviewed with the patient. Ample time for discussion and questions allowed. The patient understood, was satisfied, and agreed to proceed.  ?2.  Upper endoscopy to evaluate Hemoccult-positive stool in the gentleman on chronic NSAIDs.The nature of the procedure, as well as the risks, benefits, and alternatives were  carefully and thoroughly reviewed with the patient. Ample time for discussion and questions allowed. The patient understood, was satisfied, and agreed to proceed. ? ?No interval change in history or physical examination from the above comprehensive consultation in my office.  Now for colonoscopy and upper endoscopy ?

## 2021-04-19 ENCOUNTER — Ambulatory Visit (INDEPENDENT_AMBULATORY_CARE_PROVIDER_SITE_OTHER): Payer: BC Managed Care – PPO

## 2021-04-19 DIAGNOSIS — J309 Allergic rhinitis, unspecified: Secondary | ICD-10-CM | POA: Diagnosis not present

## 2021-04-22 ENCOUNTER — Telehealth: Payer: Self-pay | Admitting: *Deleted

## 2021-04-22 ENCOUNTER — Telehealth: Payer: Self-pay

## 2021-04-22 ENCOUNTER — Encounter: Payer: Self-pay | Admitting: Internal Medicine

## 2021-04-22 DIAGNOSIS — L821 Other seborrheic keratosis: Secondary | ICD-10-CM | POA: Diagnosis not present

## 2021-04-22 DIAGNOSIS — L814 Other melanin hyperpigmentation: Secondary | ICD-10-CM | POA: Diagnosis not present

## 2021-04-22 DIAGNOSIS — L570 Actinic keratosis: Secondary | ICD-10-CM | POA: Diagnosis not present

## 2021-04-22 DIAGNOSIS — Z85828 Personal history of other malignant neoplasm of skin: Secondary | ICD-10-CM | POA: Diagnosis not present

## 2021-04-22 DIAGNOSIS — D1801 Hemangioma of skin and subcutaneous tissue: Secondary | ICD-10-CM | POA: Diagnosis not present

## 2021-04-22 DIAGNOSIS — C44519 Basal cell carcinoma of skin of other part of trunk: Secondary | ICD-10-CM | POA: Diagnosis not present

## 2021-04-22 NOTE — Telephone Encounter (Signed)
?  Follow up Call- ? ? ?  04/18/2021  ? 10:25 AM 08/13/2018  ?  1:20 PM  ?Call back number  ?Post procedure Call Back phone  # [phone]  ?Permission to leave phone message Yes Yes  ?  ? ?Patient questions: ? ?Do you have a fever, pain , or abdominal swelling? No. ?Pain Score  0 * ? ?Have you tolerated food without any problems? Yes.   ? ?Have you been able to return to your normal activities? Yes.   ? ?Do you have any questions about your discharge instructions: ?Diet   No. ?Medications  No. ?Follow up visit  No. ? ?Do you have questions or concerns about your Care? No. ? ?Actions: ?* If pain score is 4 or above: ?No action needed, pain <4. ? ? ?

## 2021-04-22 NOTE — Telephone Encounter (Signed)
?  Follow up Call- ? ? ?  04/18/2021  ? 10:25 AM 08/13/2018  ?  1:20 PM  ?Call back number  ?Post procedure Call Back phone  # [phone]  ?Permission to leave phone message Yes Yes  ?  ? ?Patient questions: ?Message left to call us if necessary. ?

## 2021-05-15 ENCOUNTER — Ambulatory Visit (INDEPENDENT_AMBULATORY_CARE_PROVIDER_SITE_OTHER): Payer: BC Managed Care – PPO | Admitting: *Deleted

## 2021-05-15 DIAGNOSIS — J309 Allergic rhinitis, unspecified: Secondary | ICD-10-CM | POA: Diagnosis not present

## 2021-05-21 ENCOUNTER — Ambulatory Visit: Payer: BC Managed Care – PPO | Admitting: Allergy and Immunology

## 2021-05-21 VITALS — BP 110/84 | HR 83 | Temp 98.4°F | Resp 18 | Ht 74.0 in | Wt 204.0 lb

## 2021-05-21 DIAGNOSIS — J301 Allergic rhinitis due to pollen: Secondary | ICD-10-CM

## 2021-05-21 DIAGNOSIS — J3089 Other allergic rhinitis: Secondary | ICD-10-CM | POA: Diagnosis not present

## 2021-05-21 MED ORDER — OLOPATADINE HCL 0.1 % OP SOLN: 1.0000 [drp] | Freq: Two times a day (BID) | OPHTHALMIC | 11 refills | Status: DC

## 2021-05-21 MED ORDER — EPINEPHRINE 0.3 MG/0.3ML IJ SOAJ: INTRAMUSCULAR | 3 refills | Status: AC

## 2021-05-21 MED ORDER — LORATADINE 10 MG PO TABS: 10.0000 mg | ORAL_TABLET | Freq: Two times a day (BID) | ORAL | 11 refills | Status: DC | PRN

## 2021-05-21 MED ORDER — METHYLPREDNISOLONE ACETATE 80 MG/ML IJ SUSP
80.0000 mg | Freq: Once | INTRAMUSCULAR | Status: AC
Start: 2021-05-21 — End: 2021-05-21
  Administered 2021-05-21: 80 mg via INTRAMUSCULAR

## 2021-05-21 MED ORDER — FLUTICASONE PROPIONATE 50 MCG/ACT NA SUSP: 1.0000 | Freq: Every day | NASAL | 11 refills | Status: DC

## 2021-05-21 NOTE — Patient Instructions (Signed)
? ?  1. Continue Flonase 1-2 sprays each nostril one time per day   ? ?2. Continue immunotherapy ? ?3. Depomedrol 80 IM delivered in clinic today ?  ?4.  If needed: ? ? A. OTC antihistamine - Zyrtec/Claritin/Allegra 1-2 times a day ? B. Pataday 1 drop each eye two times per day. ? ?5. Return to clinic in 1 year or earlier if problem ? ?  ? ? ?

## 2021-05-21 NOTE — Progress Notes (Signed)
? ?[place] ? ? ?Follow-up Note ? ?Referring Provider: Burnard Bunting, MD ?Primary Provider: Burnard Bunting, MD ?Date of Office Visit: 05/21/2021 ? ?Subjective:  ? ?Dalton Lopez (DOB: 03-Jun-1960) is a 61 y.o. male who [facility]21 in re-evaluation of the following: ? ?HPI: Dalton Lopez returns to this clinic in evaluation of allergic rhinoconjunctivitis.  I last saw him in this clinic on 25 April 2020.  ? ?Since the spring has arrived he has had problems with nasal congestion and sneezing and itchy eyes that is not responding optimally to his use of Flonase and use of Claritin twice a day.  Fortunately, he has not had any fever or ugly nasal discharge anosmia or headaches or other symptoms to suggest ongoing sinus infection. ? ?His immunotherapy is currently every 4 weeks without any adverse effect. ? ?Allergies as of 05/21/2021   ?No Known Allergies ?  ? ?  ?Medication List  ? ? ?ALPRAZolam 0.25 MG tablet ?Commonly known as: Duanne Moron ?Take 0.125-0.25 mg by mouth every 6 (six) hours as needed. ?  ?augmented betamethasone dipropionate 0.05 % ointment ?Commonly known as: DIPROLENE-AF ?Apply 1 application. topically as directed. ?  ?diclofenac 75 MG EC tablet ?Commonly known as: VOLTAREN ?Take 1 tablet by mouth daily. ?  ?EPINEPHrine 0.3 mg/0.3 mL Soaj injection ?Commonly known as: EpiPen 2-Pak ?Use for life-threatening allergic reactions. ?  ?Fish Oil 1200 MG Caps ?Take 1,200 mg by mouth daily. ?  ?fluticasone 50 MCG/ACT nasal spray ?Commonly known as: FLONASE ?Place 1-2 sprays into both nostrils daily. ?  ?loratadine 10 MG tablet ?Commonly known as: CLARITIN ?Take by mouth daily. ?  ?Magnesium 500 MG Caps ?Take by mouth daily. ?  ?meloxicam 15 MG tablet ?Commonly known as: MOBIC ?Take 1 tablet by mouth daily. ?  ?multivitamin with minerals Tabs tablet ?Take 1 tablet by mouth daily. ?  ?olopatadine 0.1 % ophthalmic solution ?Commonly  known as: Patanol ?Place 1 drop into the right eye 2 (two) times daily. ?  ?PARoxetine 20 MG tablet ?Commonly known as: PAXIL ?Take 20 mg by mouth daily. ?  ?tadalafil 5 MG tablet ?Commonly known as: CIALIS ?Take 5 mg by mouth daily as needed. ?  ?TYLENOL PO ?Take 650 mg by mouth daily. Take 2 pills daily ?  ? ?Past Medical History:  ?Diagnosis Date  ? Actinic keratosis   ? Allergy   ? seasonal  ? Anxiety   ? Arthritis   ? bil knees, back  ? Change in bowel habits   ? Constipation   ? DDD (degenerative disc disease), lumbar   ? History of kidney stones   ? History of moderate sun exposure   ? Post-operative nausea and vomiting   ? Skin cancer   ? basal cell , squamous cell  ? Thyroid nodule   ? benign  ? ? ?Past Surgical History:  ?Procedure Laterality Date  ? ACHILLES TENDON REPAIR    ? right   ? EXTRACORPOREAL SHOCK WAVE LITHOTRIPSY Right 02/11/2021  ? Procedure: EXTRACORPOREAL SHOCK WAVE LITHOTRIPSY (ESWL);  Surgeon: Irine Seal, MD;  Location: [facility];  Service: Urology;  Laterality: Right;  ? FOOT SURGERY Left 2011  ? joint fusion repair with screws left foot  ? KNEE ARTHROSCOPY Bilateral 11/2011  ? Bil  ? MEDIAL PARTIAL KNEE REPLACEMENT Left   ? MOHS SURGERY    ? multiple  ? SKIN BIOPSY  11/17/13  ?  forehead, left temple, central nose tip  ? ? ?Review of systems negative except as noted in HPI / PMHx or noted below: ? ?Review of Systems  ?Constitutional: Negative.   ?HENT: Negative.    ?Eyes: Negative.   ?Respiratory: Negative.    ?Cardiovascular: Negative.   ?Gastrointestinal: Negative.   ?Genitourinary: Negative.   ?Musculoskeletal: Negative.   ?Skin: Negative.   ?Neurological: Negative.   ?Endo/Heme/Allergies: Negative.   ?Psychiatric/Behavioral: Negative.    ? ? ?Objective:  ? ?Vitals:  ? 05/21/21 0836  ?BP: 110/84  ?Pulse: 83  ?Resp: 18  ?Temp: 98.4 ?F (36.9 ?C)  ?SpO2: 97%  ? ?Height: '6\' 2"'$  (188 cm)  ?Weight: 204 lb (92.5 kg)  ? ?Physical Exam ?Constitutional:   ?   Appearance: He is  not diaphoretic.  ?HENT:  ?   Head: Normocephalic.  ?   Right Ear: Tympanic membrane, ear canal and external ear normal.  ?   Left Ear: Tympanic membrane, ear canal and external ear normal.  ?   Nose: Nose normal. No mucosal edema or rhinorrhea.  ?   Mouth/Throat:  ?   Pharynx: Uvula midline. No oropharyngeal exudate.  ?Eyes:  ?   Conjunctiva/sclera: Conjunctivae normal.  ?Neck:  ?   Thyroid: No thyromegaly.  ?   Trachea: Trachea normal. No tracheal tenderness or tracheal deviation.  ?Cardiovascular:  ?   Rate and Rhythm: Normal rate and regular rhythm.  ?   Heart sounds: Normal heart sounds, S1 normal and S2 normal. No murmur heard. ?Pulmonary:  ?   Effort: No respiratory distress.  ?   Breath sounds: Normal breath sounds. No stridor. No wheezing or rales.  ?Lymphadenopathy:  ?   Head:  ?   Right side of head: No tonsillar adenopathy.  ?   Left side of head: No tonsillar adenopathy.  ?   Cervical: No cervical adenopathy.  ?Skin: ?   Findings: No erythema or rash.  ?   Nails: There is no clubbing.  ?Neurological:  ?   Mental Status: He is alert.  ? ? ?Diagnostics: none ? ?Assessment and Plan:  ? ?1. Perennial allergic rhinitis   ?2. Seasonal allergic rhinitis due to pollen   ? ? ?1. Continue Flonase 1-2 sprays each nostril one time per day   ? ?2. Continue immunotherapy ? ?3. Depomedrol 80 IM delivered in clinic today ?  ?4.  If needed: ? ? A. OTC antihistamine - Zyrtec/Claritin/Allegra 1-2 times a day ? B. Pataday 1 drop each eye two times per day. ? ?5. Return to clinic in 1 year or earlier if problem ? ?I have given Dalton Lopez a systemic steroid today to help with his springtime allergy flare.  He will continue on immunotherapy and Flonase and of course utilize other medication should they be required as noted above.  Assuming he does well with this plan I will see him back in this clinic in 1 year or earlier if there is a problem. ? ?Allena Katz, MD ?Allergy / Immunology ?[place] ?

## 2021-05-22 ENCOUNTER — Encounter: Payer: Self-pay | Admitting: Allergy and Immunology

## 2021-05-27 ENCOUNTER — Ambulatory Visit (INDEPENDENT_AMBULATORY_CARE_PROVIDER_SITE_OTHER): Payer: BC Managed Care – PPO

## 2021-05-27 DIAGNOSIS — J309 Allergic rhinitis, unspecified: Secondary | ICD-10-CM

## 2021-05-27 DIAGNOSIS — M13862 Other specified arthritis, left knee: Secondary | ICD-10-CM | POA: Diagnosis not present

## 2021-05-27 DIAGNOSIS — M13861 Other specified arthritis, right knee: Secondary | ICD-10-CM | POA: Diagnosis not present

## 2021-06-19 DIAGNOSIS — J3089 Other allergic rhinitis: Secondary | ICD-10-CM

## 2021-06-19 NOTE — Progress Notes (Signed)
VIALS EXP 06-20-22 

## 2021-06-28 ENCOUNTER — Ambulatory Visit (INDEPENDENT_AMBULATORY_CARE_PROVIDER_SITE_OTHER): Payer: BC Managed Care – PPO

## 2021-06-28 DIAGNOSIS — J309 Allergic rhinitis, unspecified: Secondary | ICD-10-CM | POA: Diagnosis not present

## 2021-07-02 DIAGNOSIS — Z85828 Personal history of other malignant neoplasm of skin: Secondary | ICD-10-CM | POA: Diagnosis not present

## 2021-07-02 DIAGNOSIS — L570 Actinic keratosis: Secondary | ICD-10-CM | POA: Diagnosis not present

## 2021-07-02 DIAGNOSIS — D692 Other nonthrombocytopenic purpura: Secondary | ICD-10-CM | POA: Diagnosis not present

## 2021-07-15 DIAGNOSIS — M1712 Unilateral primary osteoarthritis, left knee: Secondary | ICD-10-CM | POA: Diagnosis not present

## 2021-07-15 DIAGNOSIS — M25562 Pain in left knee: Secondary | ICD-10-CM | POA: Diagnosis not present

## 2021-07-24 ENCOUNTER — Ambulatory Visit (INDEPENDENT_AMBULATORY_CARE_PROVIDER_SITE_OTHER): Payer: BC Managed Care – PPO

## 2021-07-24 DIAGNOSIS — J309 Allergic rhinitis, unspecified: Secondary | ICD-10-CM

## 2021-09-02 ENCOUNTER — Ambulatory Visit (INDEPENDENT_AMBULATORY_CARE_PROVIDER_SITE_OTHER): Payer: BC Managed Care – PPO | Admitting: *Deleted

## 2021-09-02 DIAGNOSIS — J309 Allergic rhinitis, unspecified: Secondary | ICD-10-CM

## 2021-09-11 DIAGNOSIS — M170 Bilateral primary osteoarthritis of knee: Secondary | ICD-10-CM | POA: Diagnosis not present

## 2021-09-20 ENCOUNTER — Ambulatory Visit (INDEPENDENT_AMBULATORY_CARE_PROVIDER_SITE_OTHER): Payer: BC Managed Care – PPO | Admitting: *Deleted

## 2021-09-20 DIAGNOSIS — J309 Allergic rhinitis, unspecified: Secondary | ICD-10-CM

## 2021-09-25 ENCOUNTER — Ambulatory Visit (INDEPENDENT_AMBULATORY_CARE_PROVIDER_SITE_OTHER): Payer: BC Managed Care – PPO | Admitting: *Deleted

## 2021-09-25 DIAGNOSIS — J309 Allergic rhinitis, unspecified: Secondary | ICD-10-CM | POA: Diagnosis not present

## 2021-10-03 ENCOUNTER — Ambulatory Visit (INDEPENDENT_AMBULATORY_CARE_PROVIDER_SITE_OTHER): Payer: BC Managed Care – PPO | Admitting: *Deleted

## 2021-10-03 DIAGNOSIS — J309 Allergic rhinitis, unspecified: Secondary | ICD-10-CM | POA: Diagnosis not present

## 2021-10-08 ENCOUNTER — Ambulatory Visit (INDEPENDENT_AMBULATORY_CARE_PROVIDER_SITE_OTHER): Payer: BC Managed Care – PPO

## 2021-10-08 DIAGNOSIS — J309 Allergic rhinitis, unspecified: Secondary | ICD-10-CM

## 2021-10-15 DIAGNOSIS — M6281 Muscle weakness (generalized): Secondary | ICD-10-CM | POA: Diagnosis not present

## 2021-10-15 DIAGNOSIS — M542 Cervicalgia: Secondary | ICD-10-CM | POA: Diagnosis not present

## 2021-10-15 DIAGNOSIS — M79602 Pain in left arm: Secondary | ICD-10-CM | POA: Diagnosis not present

## 2021-10-15 DIAGNOSIS — R293 Abnormal posture: Secondary | ICD-10-CM | POA: Diagnosis not present

## 2021-10-17 DIAGNOSIS — M542 Cervicalgia: Secondary | ICD-10-CM | POA: Diagnosis not present

## 2021-10-17 DIAGNOSIS — M79602 Pain in left arm: Secondary | ICD-10-CM | POA: Diagnosis not present

## 2021-10-17 DIAGNOSIS — R293 Abnormal posture: Secondary | ICD-10-CM | POA: Diagnosis not present

## 2021-10-17 DIAGNOSIS — M6281 Muscle weakness (generalized): Secondary | ICD-10-CM | POA: Diagnosis not present

## 2021-10-18 ENCOUNTER — Ambulatory Visit (INDEPENDENT_AMBULATORY_CARE_PROVIDER_SITE_OTHER): Payer: BC Managed Care – PPO

## 2021-10-18 DIAGNOSIS — J309 Allergic rhinitis, unspecified: Secondary | ICD-10-CM

## 2021-10-23 DIAGNOSIS — M542 Cervicalgia: Secondary | ICD-10-CM | POA: Diagnosis not present

## 2021-10-23 DIAGNOSIS — M5412 Radiculopathy, cervical region: Secondary | ICD-10-CM | POA: Diagnosis not present

## 2021-10-23 DIAGNOSIS — M79602 Pain in left arm: Secondary | ICD-10-CM | POA: Diagnosis not present

## 2021-10-29 DIAGNOSIS — M25562 Pain in left knee: Secondary | ICD-10-CM | POA: Diagnosis not present

## 2021-10-29 DIAGNOSIS — M25662 Stiffness of left knee, not elsewhere classified: Secondary | ICD-10-CM | POA: Diagnosis not present

## 2021-10-29 DIAGNOSIS — M1712 Unilateral primary osteoarthritis, left knee: Secondary | ICD-10-CM | POA: Diagnosis not present

## 2021-10-30 DIAGNOSIS — M542 Cervicalgia: Secondary | ICD-10-CM | POA: Diagnosis not present

## 2021-10-30 DIAGNOSIS — M79602 Pain in left arm: Secondary | ICD-10-CM | POA: Diagnosis not present

## 2021-10-30 DIAGNOSIS — M5412 Radiculopathy, cervical region: Secondary | ICD-10-CM | POA: Diagnosis not present

## 2021-11-06 DIAGNOSIS — L570 Actinic keratosis: Secondary | ICD-10-CM | POA: Diagnosis not present

## 2021-11-06 DIAGNOSIS — D225 Melanocytic nevi of trunk: Secondary | ICD-10-CM | POA: Diagnosis not present

## 2021-11-06 DIAGNOSIS — L814 Other melanin hyperpigmentation: Secondary | ICD-10-CM | POA: Diagnosis not present

## 2021-11-06 DIAGNOSIS — C44319 Basal cell carcinoma of skin of other parts of face: Secondary | ICD-10-CM | POA: Diagnosis not present

## 2021-11-06 DIAGNOSIS — Z85828 Personal history of other malignant neoplasm of skin: Secondary | ICD-10-CM | POA: Diagnosis not present

## 2021-11-06 DIAGNOSIS — D1801 Hemangioma of skin and subcutaneous tissue: Secondary | ICD-10-CM | POA: Diagnosis not present

## 2021-11-06 DIAGNOSIS — L821 Other seborrheic keratosis: Secondary | ICD-10-CM | POA: Diagnosis not present

## 2021-11-06 DIAGNOSIS — C44519 Basal cell carcinoma of skin of other part of trunk: Secondary | ICD-10-CM | POA: Diagnosis not present

## 2021-11-14 ENCOUNTER — Ambulatory Visit (INDEPENDENT_AMBULATORY_CARE_PROVIDER_SITE_OTHER): Payer: BC Managed Care – PPO

## 2021-11-14 DIAGNOSIS — J309 Allergic rhinitis, unspecified: Secondary | ICD-10-CM | POA: Diagnosis not present

## 2021-11-27 DIAGNOSIS — Z125 Encounter for screening for malignant neoplasm of prostate: Secondary | ICD-10-CM | POA: Diagnosis not present

## 2021-11-27 DIAGNOSIS — E041 Nontoxic single thyroid nodule: Secondary | ICD-10-CM | POA: Diagnosis not present

## 2021-12-04 DIAGNOSIS — Z0000 Encounter for general adult medical examination without abnormal findings: Secondary | ICD-10-CM | POA: Diagnosis not present

## 2021-12-04 DIAGNOSIS — M25569 Pain in unspecified knee: Secondary | ICD-10-CM | POA: Diagnosis not present

## 2021-12-04 DIAGNOSIS — Z1331 Encounter for screening for depression: Secondary | ICD-10-CM | POA: Diagnosis not present

## 2021-12-04 DIAGNOSIS — Z23 Encounter for immunization: Secondary | ICD-10-CM | POA: Diagnosis not present

## 2021-12-04 DIAGNOSIS — Z1339 Encounter for screening examination for other mental health and behavioral disorders: Secondary | ICD-10-CM | POA: Diagnosis not present

## 2021-12-05 DIAGNOSIS — R82998 Other abnormal findings in urine: Secondary | ICD-10-CM | POA: Diagnosis not present

## 2021-12-18 ENCOUNTER — Ambulatory Visit (INDEPENDENT_AMBULATORY_CARE_PROVIDER_SITE_OTHER): Payer: BC Managed Care – PPO

## 2021-12-18 DIAGNOSIS — J309 Allergic rhinitis, unspecified: Secondary | ICD-10-CM | POA: Diagnosis not present

## 2021-12-19 DIAGNOSIS — R5383 Other fatigue: Secondary | ICD-10-CM | POA: Diagnosis not present

## 2022-01-02 ENCOUNTER — Ambulatory Visit (INDEPENDENT_AMBULATORY_CARE_PROVIDER_SITE_OTHER): Payer: BC Managed Care – PPO

## 2022-01-02 DIAGNOSIS — J309 Allergic rhinitis, unspecified: Secondary | ICD-10-CM | POA: Diagnosis not present

## 2022-01-06 DIAGNOSIS — G8918 Other acute postprocedural pain: Secondary | ICD-10-CM | POA: Diagnosis not present

## 2022-01-06 DIAGNOSIS — M1712 Unilateral primary osteoarthritis, left knee: Secondary | ICD-10-CM | POA: Diagnosis not present

## 2022-01-08 DIAGNOSIS — M1712 Unilateral primary osteoarthritis, left knee: Secondary | ICD-10-CM | POA: Diagnosis not present

## 2022-01-10 DIAGNOSIS — M1712 Unilateral primary osteoarthritis, left knee: Secondary | ICD-10-CM | POA: Diagnosis not present

## 2022-01-14 DIAGNOSIS — M1712 Unilateral primary osteoarthritis, left knee: Secondary | ICD-10-CM | POA: Diagnosis not present

## 2022-01-15 DIAGNOSIS — J3089 Other allergic rhinitis: Secondary | ICD-10-CM | POA: Diagnosis not present

## 2022-01-15 NOTE — Progress Notes (Signed)
VIAL EXP 01-16-23

## 2022-01-30 DIAGNOSIS — M1712 Unilateral primary osteoarthritis, left knee: Secondary | ICD-10-CM | POA: Diagnosis not present

## 2022-02-03 DIAGNOSIS — M1712 Unilateral primary osteoarthritis, left knee: Secondary | ICD-10-CM | POA: Diagnosis not present

## 2022-02-06 DIAGNOSIS — M1712 Unilateral primary osteoarthritis, left knee: Secondary | ICD-10-CM | POA: Diagnosis not present

## 2022-02-10 DIAGNOSIS — M1712 Unilateral primary osteoarthritis, left knee: Secondary | ICD-10-CM | POA: Diagnosis not present

## 2022-02-13 DIAGNOSIS — M1712 Unilateral primary osteoarthritis, left knee: Secondary | ICD-10-CM | POA: Diagnosis not present

## 2022-02-17 DIAGNOSIS — M1712 Unilateral primary osteoarthritis, left knee: Secondary | ICD-10-CM | POA: Diagnosis not present

## 2022-02-24 DIAGNOSIS — M79602 Pain in left arm: Secondary | ICD-10-CM | POA: Diagnosis not present

## 2022-02-24 DIAGNOSIS — M5412 Radiculopathy, cervical region: Secondary | ICD-10-CM | POA: Diagnosis not present

## 2022-02-24 DIAGNOSIS — M542 Cervicalgia: Secondary | ICD-10-CM | POA: Diagnosis not present

## 2022-03-24 ENCOUNTER — Ambulatory Visit (INDEPENDENT_AMBULATORY_CARE_PROVIDER_SITE_OTHER): Payer: BC Managed Care – PPO | Admitting: *Deleted

## 2022-03-24 DIAGNOSIS — M542 Cervicalgia: Secondary | ICD-10-CM | POA: Diagnosis not present

## 2022-03-24 DIAGNOSIS — M5412 Radiculopathy, cervical region: Secondary | ICD-10-CM | POA: Diagnosis not present

## 2022-03-24 DIAGNOSIS — J309 Allergic rhinitis, unspecified: Secondary | ICD-10-CM | POA: Diagnosis not present

## 2022-03-24 DIAGNOSIS — M79602 Pain in left arm: Secondary | ICD-10-CM | POA: Diagnosis not present

## 2022-04-22 ENCOUNTER — Ambulatory Visit (INDEPENDENT_AMBULATORY_CARE_PROVIDER_SITE_OTHER): Payer: BC Managed Care – PPO

## 2022-04-22 DIAGNOSIS — J309 Allergic rhinitis, unspecified: Secondary | ICD-10-CM | POA: Diagnosis not present

## 2022-04-24 DIAGNOSIS — M79602 Pain in left arm: Secondary | ICD-10-CM | POA: Diagnosis not present

## 2022-04-24 DIAGNOSIS — M542 Cervicalgia: Secondary | ICD-10-CM | POA: Diagnosis not present

## 2022-04-24 DIAGNOSIS — M5412 Radiculopathy, cervical region: Secondary | ICD-10-CM | POA: Diagnosis not present

## 2022-05-01 ENCOUNTER — Ambulatory Visit (INDEPENDENT_AMBULATORY_CARE_PROVIDER_SITE_OTHER): Payer: BC Managed Care – PPO

## 2022-05-01 DIAGNOSIS — J309 Allergic rhinitis, unspecified: Secondary | ICD-10-CM

## 2022-05-06 DIAGNOSIS — J3089 Other allergic rhinitis: Secondary | ICD-10-CM | POA: Diagnosis not present

## 2022-05-06 NOTE — Progress Notes (Signed)
VIALS EXP 05-06-23 

## 2022-05-08 ENCOUNTER — Ambulatory Visit (INDEPENDENT_AMBULATORY_CARE_PROVIDER_SITE_OTHER): Payer: BC Managed Care – PPO

## 2022-05-08 DIAGNOSIS — J309 Allergic rhinitis, unspecified: Secondary | ICD-10-CM | POA: Diagnosis not present

## 2022-05-08 DIAGNOSIS — R351 Nocturia: Secondary | ICD-10-CM | POA: Diagnosis not present

## 2022-05-08 DIAGNOSIS — R3121 Asymptomatic microscopic hematuria: Secondary | ICD-10-CM | POA: Diagnosis not present

## 2022-05-13 ENCOUNTER — Ambulatory Visit (INDEPENDENT_AMBULATORY_CARE_PROVIDER_SITE_OTHER): Payer: BC Managed Care – PPO

## 2022-05-13 DIAGNOSIS — R6882 Decreased libido: Secondary | ICD-10-CM | POA: Diagnosis not present

## 2022-05-13 DIAGNOSIS — J309 Allergic rhinitis, unspecified: Secondary | ICD-10-CM | POA: Diagnosis not present

## 2022-05-13 DIAGNOSIS — E349 Endocrine disorder, unspecified: Secondary | ICD-10-CM | POA: Diagnosis not present

## 2022-05-14 DIAGNOSIS — Z85828 Personal history of other malignant neoplasm of skin: Secondary | ICD-10-CM | POA: Diagnosis not present

## 2022-05-14 DIAGNOSIS — D2271 Melanocytic nevi of right lower limb, including hip: Secondary | ICD-10-CM | POA: Diagnosis not present

## 2022-05-14 DIAGNOSIS — D225 Melanocytic nevi of trunk: Secondary | ICD-10-CM | POA: Diagnosis not present

## 2022-05-14 DIAGNOSIS — D485 Neoplasm of uncertain behavior of skin: Secondary | ICD-10-CM | POA: Diagnosis not present

## 2022-05-14 DIAGNOSIS — L570 Actinic keratosis: Secondary | ICD-10-CM | POA: Diagnosis not present

## 2022-05-14 DIAGNOSIS — D0439 Carcinoma in situ of skin of other parts of face: Secondary | ICD-10-CM | POA: Diagnosis not present

## 2022-05-14 DIAGNOSIS — L814 Other melanin hyperpigmentation: Secondary | ICD-10-CM | POA: Diagnosis not present

## 2022-05-14 DIAGNOSIS — L820 Inflamed seborrheic keratosis: Secondary | ICD-10-CM | POA: Diagnosis not present

## 2022-05-14 DIAGNOSIS — C44622 Squamous cell carcinoma of skin of right upper limb, including shoulder: Secondary | ICD-10-CM | POA: Diagnosis not present

## 2022-05-15 DIAGNOSIS — M19072 Primary osteoarthritis, left ankle and foot: Secondary | ICD-10-CM | POA: Diagnosis not present

## 2022-05-22 DIAGNOSIS — M79602 Pain in left arm: Secondary | ICD-10-CM | POA: Diagnosis not present

## 2022-05-22 DIAGNOSIS — M5412 Radiculopathy, cervical region: Secondary | ICD-10-CM | POA: Diagnosis not present

## 2022-05-22 DIAGNOSIS — M542 Cervicalgia: Secondary | ICD-10-CM | POA: Diagnosis not present

## 2022-05-23 ENCOUNTER — Ambulatory Visit (INDEPENDENT_AMBULATORY_CARE_PROVIDER_SITE_OTHER): Payer: BC Managed Care – PPO

## 2022-05-23 DIAGNOSIS — J309 Allergic rhinitis, unspecified: Secondary | ICD-10-CM | POA: Diagnosis not present

## 2022-05-30 ENCOUNTER — Ambulatory Visit (INDEPENDENT_AMBULATORY_CARE_PROVIDER_SITE_OTHER): Payer: BC Managed Care – PPO | Admitting: *Deleted

## 2022-05-30 DIAGNOSIS — J309 Allergic rhinitis, unspecified: Secondary | ICD-10-CM | POA: Diagnosis not present

## 2022-06-03 DIAGNOSIS — M19072 Primary osteoarthritis, left ankle and foot: Secondary | ICD-10-CM | POA: Diagnosis not present

## 2022-06-06 DIAGNOSIS — R3121 Asymptomatic microscopic hematuria: Secondary | ICD-10-CM | POA: Diagnosis not present

## 2022-06-06 DIAGNOSIS — N200 Calculus of kidney: Secondary | ICD-10-CM | POA: Diagnosis not present

## 2022-06-25 DIAGNOSIS — G5762 Lesion of plantar nerve, left lower limb: Secondary | ICD-10-CM | POA: Diagnosis not present

## 2022-06-25 DIAGNOSIS — M19072 Primary osteoarthritis, left ankle and foot: Secondary | ICD-10-CM | POA: Diagnosis not present

## 2022-06-27 ENCOUNTER — Ambulatory Visit (INDEPENDENT_AMBULATORY_CARE_PROVIDER_SITE_OTHER): Payer: BC Managed Care – PPO | Admitting: *Deleted

## 2022-06-27 DIAGNOSIS — J309 Allergic rhinitis, unspecified: Secondary | ICD-10-CM

## 2022-07-07 ENCOUNTER — Ambulatory Visit (INDEPENDENT_AMBULATORY_CARE_PROVIDER_SITE_OTHER): Payer: BC Managed Care – PPO | Admitting: *Deleted

## 2022-07-07 DIAGNOSIS — M5412 Radiculopathy, cervical region: Secondary | ICD-10-CM | POA: Diagnosis not present

## 2022-07-07 DIAGNOSIS — M79602 Pain in left arm: Secondary | ICD-10-CM | POA: Diagnosis not present

## 2022-07-07 DIAGNOSIS — J309 Allergic rhinitis, unspecified: Secondary | ICD-10-CM

## 2022-07-07 DIAGNOSIS — M542 Cervicalgia: Secondary | ICD-10-CM | POA: Diagnosis not present

## 2022-07-08 DIAGNOSIS — C44229 Squamous cell carcinoma of skin of left ear and external auricular canal: Secondary | ICD-10-CM | POA: Diagnosis not present

## 2022-07-08 DIAGNOSIS — C44329 Squamous cell carcinoma of skin of other parts of face: Secondary | ICD-10-CM | POA: Diagnosis not present

## 2022-07-08 DIAGNOSIS — L570 Actinic keratosis: Secondary | ICD-10-CM | POA: Diagnosis not present

## 2022-07-08 DIAGNOSIS — L820 Inflamed seborrheic keratosis: Secondary | ICD-10-CM | POA: Diagnosis not present

## 2022-07-22 ENCOUNTER — Ambulatory Visit (INDEPENDENT_AMBULATORY_CARE_PROVIDER_SITE_OTHER): Payer: BC Managed Care – PPO

## 2022-07-22 DIAGNOSIS — J309 Allergic rhinitis, unspecified: Secondary | ICD-10-CM

## 2022-08-04 DIAGNOSIS — M79602 Pain in left arm: Secondary | ICD-10-CM | POA: Diagnosis not present

## 2022-08-04 DIAGNOSIS — M542 Cervicalgia: Secondary | ICD-10-CM | POA: Diagnosis not present

## 2022-08-04 DIAGNOSIS — M5412 Radiculopathy, cervical region: Secondary | ICD-10-CM | POA: Diagnosis not present

## 2022-08-07 ENCOUNTER — Ambulatory Visit (INDEPENDENT_AMBULATORY_CARE_PROVIDER_SITE_OTHER): Payer: BC Managed Care – PPO

## 2022-08-07 DIAGNOSIS — J309 Allergic rhinitis, unspecified: Secondary | ICD-10-CM

## 2022-08-11 DIAGNOSIS — M542 Cervicalgia: Secondary | ICD-10-CM | POA: Diagnosis not present

## 2022-08-11 DIAGNOSIS — M79602 Pain in left arm: Secondary | ICD-10-CM | POA: Diagnosis not present

## 2022-08-11 DIAGNOSIS — M5412 Radiculopathy, cervical region: Secondary | ICD-10-CM | POA: Diagnosis not present

## 2022-08-13 ENCOUNTER — Ambulatory Visit (INDEPENDENT_AMBULATORY_CARE_PROVIDER_SITE_OTHER): Payer: BC Managed Care – PPO | Admitting: *Deleted

## 2022-08-13 DIAGNOSIS — J309 Allergic rhinitis, unspecified: Secondary | ICD-10-CM

## 2022-08-18 ENCOUNTER — Ambulatory Visit (INDEPENDENT_AMBULATORY_CARE_PROVIDER_SITE_OTHER): Payer: BC Managed Care – PPO

## 2022-08-18 DIAGNOSIS — J309 Allergic rhinitis, unspecified: Secondary | ICD-10-CM | POA: Diagnosis not present

## 2022-08-27 DIAGNOSIS — R351 Nocturia: Secondary | ICD-10-CM | POA: Diagnosis not present

## 2022-08-27 DIAGNOSIS — E349 Endocrine disorder, unspecified: Secondary | ICD-10-CM | POA: Diagnosis not present

## 2022-08-27 DIAGNOSIS — N200 Calculus of kidney: Secondary | ICD-10-CM | POA: Diagnosis not present

## 2022-09-08 DIAGNOSIS — M79602 Pain in left arm: Secondary | ICD-10-CM | POA: Diagnosis not present

## 2022-09-08 DIAGNOSIS — M5412 Radiculopathy, cervical region: Secondary | ICD-10-CM | POA: Diagnosis not present

## 2022-09-08 DIAGNOSIS — M542 Cervicalgia: Secondary | ICD-10-CM | POA: Diagnosis not present

## 2022-09-11 DIAGNOSIS — M19072 Primary osteoarthritis, left ankle and foot: Secondary | ICD-10-CM | POA: Diagnosis not present

## 2022-09-23 DIAGNOSIS — C44229 Squamous cell carcinoma of skin of left ear and external auricular canal: Secondary | ICD-10-CM | POA: Diagnosis not present

## 2022-09-23 DIAGNOSIS — D2272 Melanocytic nevi of left lower limb, including hip: Secondary | ICD-10-CM | POA: Diagnosis not present

## 2022-09-23 DIAGNOSIS — D225 Melanocytic nevi of trunk: Secondary | ICD-10-CM | POA: Diagnosis not present

## 2022-09-23 DIAGNOSIS — Z85828 Personal history of other malignant neoplasm of skin: Secondary | ICD-10-CM | POA: Diagnosis not present

## 2022-09-23 DIAGNOSIS — L570 Actinic keratosis: Secondary | ICD-10-CM | POA: Diagnosis not present

## 2022-09-23 DIAGNOSIS — D2271 Melanocytic nevi of right lower limb, including hip: Secondary | ICD-10-CM | POA: Diagnosis not present

## 2022-09-23 DIAGNOSIS — C44519 Basal cell carcinoma of skin of other part of trunk: Secondary | ICD-10-CM | POA: Diagnosis not present

## 2022-09-23 DIAGNOSIS — L820 Inflamed seborrheic keratosis: Secondary | ICD-10-CM | POA: Diagnosis not present

## 2022-10-02 DIAGNOSIS — M19072 Primary osteoarthritis, left ankle and foot: Secondary | ICD-10-CM | POA: Diagnosis not present

## 2022-10-03 DIAGNOSIS — Z01818 Encounter for other preprocedural examination: Secondary | ICD-10-CM | POA: Diagnosis not present

## 2022-10-03 DIAGNOSIS — Z23 Encounter for immunization: Secondary | ICD-10-CM | POA: Diagnosis not present

## 2022-10-03 DIAGNOSIS — Z1383 Encounter for screening for respiratory disorder NEC: Secondary | ICD-10-CM | POA: Diagnosis not present

## 2022-10-03 DIAGNOSIS — M1711 Unilateral primary osteoarthritis, right knee: Secondary | ICD-10-CM | POA: Diagnosis not present

## 2022-10-06 DIAGNOSIS — M5412 Radiculopathy, cervical region: Secondary | ICD-10-CM | POA: Diagnosis not present

## 2022-10-06 DIAGNOSIS — M542 Cervicalgia: Secondary | ICD-10-CM | POA: Diagnosis not present

## 2022-10-06 DIAGNOSIS — M79602 Pain in left arm: Secondary | ICD-10-CM | POA: Diagnosis not present

## 2022-10-16 ENCOUNTER — Ambulatory Visit (INDEPENDENT_AMBULATORY_CARE_PROVIDER_SITE_OTHER): Payer: BC Managed Care – PPO

## 2022-10-16 DIAGNOSIS — J309 Allergic rhinitis, unspecified: Secondary | ICD-10-CM | POA: Diagnosis not present

## 2022-10-20 DIAGNOSIS — M25761 Osteophyte, right knee: Secondary | ICD-10-CM | POA: Diagnosis not present

## 2022-10-20 DIAGNOSIS — M1711 Unilateral primary osteoarthritis, right knee: Secondary | ICD-10-CM | POA: Diagnosis not present

## 2022-11-03 DIAGNOSIS — M25561 Pain in right knee: Secondary | ICD-10-CM | POA: Diagnosis not present

## 2022-11-06 DIAGNOSIS — M25561 Pain in right knee: Secondary | ICD-10-CM | POA: Diagnosis not present

## 2022-11-12 DIAGNOSIS — M25561 Pain in right knee: Secondary | ICD-10-CM | POA: Diagnosis not present

## 2022-11-21 DIAGNOSIS — M25561 Pain in right knee: Secondary | ICD-10-CM | POA: Diagnosis not present

## 2022-11-27 DIAGNOSIS — M25561 Pain in right knee: Secondary | ICD-10-CM | POA: Diagnosis not present

## 2022-12-03 DIAGNOSIS — E041 Nontoxic single thyroid nodule: Secondary | ICD-10-CM | POA: Diagnosis not present

## 2022-12-03 DIAGNOSIS — Z4789 Encounter for other orthopedic aftercare: Secondary | ICD-10-CM | POA: Diagnosis not present

## 2022-12-03 DIAGNOSIS — R7989 Other specified abnormal findings of blood chemistry: Secondary | ICD-10-CM | POA: Diagnosis not present

## 2022-12-03 DIAGNOSIS — Z125 Encounter for screening for malignant neoplasm of prostate: Secondary | ICD-10-CM | POA: Diagnosis not present

## 2022-12-08 DIAGNOSIS — M25561 Pain in right knee: Secondary | ICD-10-CM | POA: Diagnosis not present

## 2022-12-10 DIAGNOSIS — Z1339 Encounter for screening examination for other mental health and behavioral disorders: Secondary | ICD-10-CM | POA: Diagnosis not present

## 2022-12-10 DIAGNOSIS — Z1331 Encounter for screening for depression: Secondary | ICD-10-CM | POA: Diagnosis not present

## 2022-12-10 DIAGNOSIS — Z0000 Encounter for general adult medical examination without abnormal findings: Secondary | ICD-10-CM | POA: Diagnosis not present

## 2022-12-10 DIAGNOSIS — R82998 Other abnormal findings in urine: Secondary | ICD-10-CM | POA: Diagnosis not present

## 2023-01-10 DIAGNOSIS — L089 Local infection of the skin and subcutaneous tissue, unspecified: Secondary | ICD-10-CM | POA: Diagnosis not present

## 2023-01-10 DIAGNOSIS — L720 Epidermal cyst: Secondary | ICD-10-CM | POA: Diagnosis not present

## 2023-01-12 DIAGNOSIS — D225 Melanocytic nevi of trunk: Secondary | ICD-10-CM | POA: Diagnosis not present

## 2023-01-12 DIAGNOSIS — L570 Actinic keratosis: Secondary | ICD-10-CM | POA: Diagnosis not present

## 2023-01-12 DIAGNOSIS — D485 Neoplasm of uncertain behavior of skin: Secondary | ICD-10-CM | POA: Diagnosis not present

## 2023-01-12 DIAGNOSIS — Z85828 Personal history of other malignant neoplasm of skin: Secondary | ICD-10-CM | POA: Diagnosis not present

## 2023-01-12 DIAGNOSIS — D1801 Hemangioma of skin and subcutaneous tissue: Secondary | ICD-10-CM | POA: Diagnosis not present

## 2023-01-12 DIAGNOSIS — M19072 Primary osteoarthritis, left ankle and foot: Secondary | ICD-10-CM | POA: Diagnosis not present

## 2023-01-12 DIAGNOSIS — L814 Other melanin hyperpigmentation: Secondary | ICD-10-CM | POA: Diagnosis not present

## 2023-01-12 DIAGNOSIS — L821 Other seborrheic keratosis: Secondary | ICD-10-CM | POA: Diagnosis not present

## 2023-01-12 DIAGNOSIS — C44619 Basal cell carcinoma of skin of left upper limb, including shoulder: Secondary | ICD-10-CM | POA: Diagnosis not present

## 2023-01-26 DIAGNOSIS — D485 Neoplasm of uncertain behavior of skin: Secondary | ICD-10-CM | POA: Diagnosis not present

## 2023-01-26 DIAGNOSIS — L988 Other specified disorders of the skin and subcutaneous tissue: Secondary | ICD-10-CM | POA: Diagnosis not present

## 2023-02-04 ENCOUNTER — Telehealth: Payer: Self-pay | Admitting: *Deleted

## 2023-02-04 ENCOUNTER — Ambulatory Visit: Payer: Self-pay | Admitting: *Deleted

## 2023-02-04 NOTE — Telephone Encounter (Signed)
Patient's allergy flow sheet has been updated to reflect these changes.

## 2023-02-04 NOTE — Telephone Encounter (Signed)
 Patient was last here for an allergy injection on 10/16/2022 and received 0.025 of his Red vials. Please advise the dosage for the patient. Thank You.

## 2023-02-10 DIAGNOSIS — R058 Other specified cough: Secondary | ICD-10-CM | POA: Diagnosis not present

## 2023-02-10 DIAGNOSIS — M545 Low back pain, unspecified: Secondary | ICD-10-CM | POA: Diagnosis not present

## 2023-02-10 DIAGNOSIS — J209 Acute bronchitis, unspecified: Secondary | ICD-10-CM | POA: Diagnosis not present

## 2023-03-20 DIAGNOSIS — R059 Cough, unspecified: Secondary | ICD-10-CM | POA: Diagnosis not present

## 2023-03-20 DIAGNOSIS — U071 COVID-19: Secondary | ICD-10-CM | POA: Diagnosis not present

## 2023-03-24 NOTE — Telephone Encounter (Signed)
 Patient stated he will be in this week.

## 2023-03-25 DIAGNOSIS — L02221 Furuncle of abdominal wall: Secondary | ICD-10-CM | POA: Diagnosis not present

## 2023-03-25 DIAGNOSIS — L0889 Other specified local infections of the skin and subcutaneous tissue: Secondary | ICD-10-CM | POA: Diagnosis not present

## 2023-03-27 ENCOUNTER — Ambulatory Visit (INDEPENDENT_AMBULATORY_CARE_PROVIDER_SITE_OTHER): Payer: Self-pay | Admitting: *Deleted

## 2023-03-27 DIAGNOSIS — J309 Allergic rhinitis, unspecified: Secondary | ICD-10-CM | POA: Diagnosis not present

## 2023-03-30 ENCOUNTER — Ambulatory Visit (INDEPENDENT_AMBULATORY_CARE_PROVIDER_SITE_OTHER): Payer: Self-pay

## 2023-03-30 DIAGNOSIS — J309 Allergic rhinitis, unspecified: Secondary | ICD-10-CM | POA: Diagnosis not present

## 2023-03-30 NOTE — Progress Notes (Signed)
 VIAL 1 MADE 03-30-23. EXP 03-29-24

## 2023-03-31 DIAGNOSIS — J3089 Other allergic rhinitis: Secondary | ICD-10-CM

## 2023-03-31 NOTE — Progress Notes (Signed)
 VIAL 2 MADE 03-31-23. MADE 03-30-24

## 2023-04-02 DIAGNOSIS — M19072 Primary osteoarthritis, left ankle and foot: Secondary | ICD-10-CM | POA: Diagnosis not present

## 2023-04-10 ENCOUNTER — Ambulatory Visit (INDEPENDENT_AMBULATORY_CARE_PROVIDER_SITE_OTHER): Payer: Self-pay

## 2023-04-10 DIAGNOSIS — J309 Allergic rhinitis, unspecified: Secondary | ICD-10-CM | POA: Diagnosis not present

## 2023-04-16 ENCOUNTER — Ambulatory Visit (INDEPENDENT_AMBULATORY_CARE_PROVIDER_SITE_OTHER): Payer: Self-pay

## 2023-04-16 DIAGNOSIS — J309 Allergic rhinitis, unspecified: Secondary | ICD-10-CM

## 2023-05-04 ENCOUNTER — Ambulatory Visit (INDEPENDENT_AMBULATORY_CARE_PROVIDER_SITE_OTHER): Payer: Self-pay

## 2023-05-04 DIAGNOSIS — J309 Allergic rhinitis, unspecified: Secondary | ICD-10-CM | POA: Diagnosis not present

## 2023-05-04 MED ORDER — EPINEPHRINE 0.3 MG/0.3ML IJ SOAJ: 0.3000 mg | INTRAMUSCULAR | 1 refills | Status: DC | PRN

## 2023-05-17 DIAGNOSIS — R059 Cough, unspecified: Secondary | ICD-10-CM | POA: Diagnosis not present

## 2023-05-17 DIAGNOSIS — J069 Acute upper respiratory infection, unspecified: Secondary | ICD-10-CM | POA: Diagnosis not present

## 2023-05-17 DIAGNOSIS — R0981 Nasal congestion: Secondary | ICD-10-CM | POA: Diagnosis not present

## 2023-06-19 DIAGNOSIS — H02834 Dermatochalasis of left upper eyelid: Secondary | ICD-10-CM | POA: Diagnosis not present

## 2023-06-19 DIAGNOSIS — H02831 Dermatochalasis of right upper eyelid: Secondary | ICD-10-CM | POA: Diagnosis not present

## 2023-06-19 DIAGNOSIS — H02832 Dermatochalasis of right lower eyelid: Secondary | ICD-10-CM | POA: Diagnosis not present

## 2023-06-19 DIAGNOSIS — H02835 Dermatochalasis of left lower eyelid: Secondary | ICD-10-CM | POA: Diagnosis not present

## 2023-08-21 DIAGNOSIS — H02834 Dermatochalasis of left upper eyelid: Secondary | ICD-10-CM | POA: Diagnosis not present

## 2023-08-21 DIAGNOSIS — H02831 Dermatochalasis of right upper eyelid: Secondary | ICD-10-CM | POA: Diagnosis not present

## 2023-08-21 DIAGNOSIS — H02835 Dermatochalasis of left lower eyelid: Secondary | ICD-10-CM | POA: Diagnosis not present

## 2023-08-21 DIAGNOSIS — H02832 Dermatochalasis of right lower eyelid: Secondary | ICD-10-CM | POA: Diagnosis not present

## 2023-08-24 DIAGNOSIS — M546 Pain in thoracic spine: Secondary | ICD-10-CM | POA: Diagnosis not present

## 2023-08-26 DIAGNOSIS — L0889 Other specified local infections of the skin and subcutaneous tissue: Secondary | ICD-10-CM | POA: Diagnosis not present

## 2023-08-26 DIAGNOSIS — L02415 Cutaneous abscess of right lower limb: Secondary | ICD-10-CM | POA: Diagnosis not present

## 2023-09-22 DIAGNOSIS — L988 Other specified disorders of the skin and subcutaneous tissue: Secondary | ICD-10-CM | POA: Diagnosis not present

## 2023-09-22 DIAGNOSIS — H02831 Dermatochalasis of right upper eyelid: Secondary | ICD-10-CM | POA: Diagnosis not present

## 2023-09-22 DIAGNOSIS — M150 Primary generalized (osteo)arthritis: Secondary | ICD-10-CM | POA: Diagnosis not present

## 2023-09-22 DIAGNOSIS — H02835 Dermatochalasis of left lower eyelid: Secondary | ICD-10-CM | POA: Diagnosis not present

## 2023-09-22 DIAGNOSIS — H02832 Dermatochalasis of right lower eyelid: Secondary | ICD-10-CM | POA: Diagnosis not present

## 2023-09-22 DIAGNOSIS — Z79899 Other long term (current) drug therapy: Secondary | ICD-10-CM | POA: Diagnosis not present

## 2023-09-22 DIAGNOSIS — H02839 Dermatochalasis of unspecified eye, unspecified eyelid: Secondary | ICD-10-CM | POA: Diagnosis not present

## 2023-09-22 DIAGNOSIS — H02834 Dermatochalasis of left upper eyelid: Secondary | ICD-10-CM | POA: Diagnosis not present

## 2023-09-23 ENCOUNTER — Institutional Professional Consult (permissible substitution) (INDEPENDENT_AMBULATORY_CARE_PROVIDER_SITE_OTHER)

## 2023-10-07 ENCOUNTER — Encounter (INDEPENDENT_AMBULATORY_CARE_PROVIDER_SITE_OTHER): Payer: Self-pay

## 2023-10-07 ENCOUNTER — Ambulatory Visit (INDEPENDENT_AMBULATORY_CARE_PROVIDER_SITE_OTHER)

## 2023-10-07 VITALS — BP 122/84 | HR 82 | Temp 97.9°F

## 2023-10-07 DIAGNOSIS — Z974 Presence of external hearing-aid: Secondary | ICD-10-CM

## 2023-10-07 DIAGNOSIS — H04211 Epiphora due to excess lacrimation, right lacrimal gland: Secondary | ICD-10-CM

## 2023-10-07 DIAGNOSIS — H938X1 Other specified disorders of right ear: Secondary | ICD-10-CM

## 2023-10-07 DIAGNOSIS — H608X1 Other otitis externa, right ear: Secondary | ICD-10-CM

## 2023-10-07 MED ORDER — FLUOCINOLONE ACETONIDE 0.01 % EX CREA: TOPICAL_CREAM | CUTANEOUS | 0 refills | Status: DC | PRN

## 2023-10-07 NOTE — Progress Notes (Signed)
 Dear Dr. Shepard, Here is my assessment for our mutual patient, Dalton Lopez. Thank you for allowing me the opportunity to care for your patient. Please do not hesitate to contact me should you have any other questions. Sincerely, Dr. Penne Croak  Otolaryngology Clinic Note Referring provider: Dr. Shepard HPI:  Dalton Lopez is a 63 y.o. male kindly referred by Dr. Shepard for evaluation of right ear fullness.  The patient reports a sensation of the right ear feeling blocked for approximately six months, noting difficulty inserting a Q-tip. The patient uses hearing aids and has not noticed a significant change in hearing.   The patient has a history of allergies, with persistent symptoms throughout the summer, including a blurred right eye. Occasional dry eye. Has tried pataday  for symptoms.     H&N Surgery: tonsillectomy Personal or FHx of bleeding dz or anesthesia difficulty: no    Independent Review of Additional Tests or Records:  none  PMH/Meds/All/SocHx/FamHx/ROS:   Past Medical History:  Diagnosis Date   Actinic keratosis    Allergy    seasonal   Anxiety    Arthritis    bil knees, back   Change in bowel habits    Constipation    DDD (degenerative disc disease), lumbar    History of kidney stones    History of moderate sun exposure    Post-operative nausea and vomiting    Skin cancer    basal cell , squamous cell   Thyroid  nodule    benign     Past Surgical History:  Procedure Laterality Date   ACHILLES TENDON REPAIR     right    EXTRACORPOREAL SHOCK WAVE LITHOTRIPSY Right 02/11/2021   Procedure: EXTRACORPOREAL SHOCK WAVE LITHOTRIPSY (ESWL);  Surgeon: Watt Rush, MD;  Location: [facility];  Service: Urology;  Laterality: Right;   FOOT SURGERY Left 2011   joint fusion repair with screws left foot   KNEE ARTHROSCOPY Bilateral 11/2011   Bil   MEDIAL PARTIAL KNEE REPLACEMENT Left    MOHS SURGERY     multiple   SKIN BIOPSY  11/17/13   forehead,  left temple, central nose tip    Family History  Problem Relation Age of Onset   Asthma Father    Skin cancer Father    Allergic rhinitis Father    Skin cancer Paternal Grandmother    Ovarian cancer Paternal Grandmother    Allergic rhinitis Daughter    Colon cancer Neg Hx    Angioedema Neg Hx    Eczema Neg Hx    Esophageal cancer Neg Hx    Pancreatic cancer Neg Hx    Stomach cancer Neg Hx    Liver disease Neg Hx      Social Connections: Not on file      Current Outpatient Medications:    fluocinolone  0.01 % cream, Apply topically as needed. Apply small amount in ear canal as needed for ear itch and irritation. Can apply 2 times a day, Disp: 30 g, Rfl: 0   Multiple Vitamin (MULTIVITAMIN WITH MINERALS) TABS tablet, Take 1 tablet by mouth daily., Disp: , Rfl:    Omega-3 Fatty Acids (FISH OIL) 1200 MG CAPS, Take 1,200 mg by mouth daily. , Disp: , Rfl:    PARoxetine (PAXIL) 20 MG tablet, Take 10 mg by mouth daily., Disp: , Rfl: 2   Acetaminophen  (TYLENOL  PO), Take 650 mg by mouth daily. Take 2 pills daily (Patient not taking: Reported on 10/07/2023), Disp: , Rfl:  ALPRAZolam (XANAX) 0.25 MG tablet, Take 0.125-0.25 mg by mouth every 6 (six) hours as needed. (Patient not taking: Reported on 10/07/2023), Disp: , Rfl:    diclofenac  (VOLTAREN ) 75 MG EC tablet, Take 1 tablet by mouth daily. (Patient not taking: Reported on 10/07/2023), Disp: , Rfl:    EPINEPHrine  (EPIPEN  2-PAK) 0.3 mg/0.3 mL IJ SOAJ injection, Use for life-threatening allergic reactions. (Patient not taking: Reported on 10/07/2023), Disp: 2 each, Rfl: 3   EPINEPHrine  0.3 mg/0.3 mL IJ SOAJ injection, Inject 0.3 mg into the muscle as needed for anaphylaxis. (Patient not taking: Reported on 10/07/2023), Disp: 0.3 mL, Rfl: 1   fluticasone  (FLONASE ) 50 MCG/ACT nasal spray, Place 1-2 sprays into both nostrils daily. (Patient not taking: Reported on 10/07/2023), Disp: 16 g, Rfl: 11   loratadine  (CLARITIN ) 10 MG tablet, Take 1 tablet (10  mg total) by mouth 2 (two) times daily as needed for allergies (Can take an extra dose during flare ups.). (Patient not taking: Reported on 10/07/2023), Disp: 60 tablet, Rfl: 11   Magnesium 500 MG CAPS, Take by mouth daily. (Patient not taking: Reported on 10/07/2023), Disp: , Rfl:    meloxicam (MOBIC) 15 MG tablet, Take 1 tablet by mouth daily. (Patient not taking: Reported on 10/07/2023), Disp: , Rfl:    olopatadine  (PATANOL) 0.1 % ophthalmic solution, Place 1 drop into the right eye 2 (two) times daily. (Patient not taking: Reported on 10/07/2023), Disp: 5 mL, Rfl: 11   tadalafil (CIALIS) 5 MG tablet, Take 5 mg by mouth daily as needed. (Patient not taking: Reported on 10/07/2023), Disp: , Rfl:    Physical Exam:   BP 122/84   Pulse 82   Temp 97.9 F (36.6 C)   SpO2 95%   Salient findings:  General: awake, alert, pleasant HEENT: NCAT, clear sclera, no nystagmus, pinna without external lesions, EAC clear, TM demonstrating normal landmarks and aerated middle ear, septum midline, bilateral inferior turbinates with1+, dentition good, tonsils absent, symmetric tongue and palate movement, no oral cavity lesions, clear posterior oropharynx. Normal weber and rinne.  Neck: trachea midline, no masses/ymphadenopathy/thyromegaly CN II-XII grossly intact No respiratory distress or stridor   Seprately Identifiable Procedures:  I personally ordered, reviewed and interpreted the following with the patient today  Procedure: Bilateral ear microscopy using microscope (CPT 92504) Pre-procedure diagnosis: right ear fullness Post-procedure diagnosis: same Indication: see above; given patient's otologic complaints and history, for improved and comprehensive examination of external ear and tympanic membrane, bilateral otologic examination using microscope was performed. Prior to proceeding, verbal consent was obtained after discussion of R/B/A  Procedure: Patient was placed semi-recumbent. Both ear canals were  examined using the microscope with findings below. Patient tolerated the procedure well.  Right ear:  No significant lesions pinna. EAC: no significant lesions. Canal is clear. Eczematoid changes. present TM: Intact    Impression & Plans:  Dalton Lopez is a 63 y.o. male with right ear fullness. Physical exam with unremarkable ear exam. Patent EAC. Mild asymmetry to the canal curvature compared to the other side, which I appear he's noticing. His friend had a tumor of the ear, which increased his attention.  1. Ear fullness, right   2. Epiphora due to excess lacrimation of right side   3. Uses hearing aid     Plan - today's findings were discussed with the patient - today's diagnoses were discussed in detail with the patient - Obtain audiogram and follow up after testing. Okay with our office or outside location - continue hearing  aids - Recommend preservative free eye drops. Offered rx for allergy eye drops and declined. - Follow up in 3 months or sooner if new or worsening symptoms develop  Orders Placed This Encounter  Procedures   Ambulatory referral to Audiology    - See below regarding exact medications prescribed this encounter including dosages and route: Meds ordered this encounter  Medications   fluocinolone  0.01 % cream    Sig: Apply topically as needed. Apply small amount in ear canal as needed for ear itch and irritation. Can apply 2 times a day    Dispense:  30 g    Refill:  0   Thank you for allowing me the opportunity to care for your patient. Please do not hesitate to contact me should you have any other questions.  Sincerely, Penne Croak, DO Otolaryngologist (ENT) [facility] Phone: [phone] Fax: [phone]  10/07/2023, 9:16 AM

## 2023-10-13 ENCOUNTER — Ambulatory Visit: Admitting: Allergy and Immunology

## 2023-10-13 ENCOUNTER — Encounter: Payer: Self-pay | Admitting: Allergy and Immunology

## 2023-10-13 VITALS — BP 110/64 | HR 80 | Temp 98.1°F | Resp 16 | Ht 74.0 in | Wt 198.6 lb

## 2023-10-13 DIAGNOSIS — J301 Allergic rhinitis due to pollen: Secondary | ICD-10-CM

## 2023-10-13 DIAGNOSIS — J3089 Other allergic rhinitis: Secondary | ICD-10-CM

## 2023-10-13 DIAGNOSIS — H1013 Acute atopic conjunctivitis, bilateral: Secondary | ICD-10-CM

## 2023-10-13 DIAGNOSIS — H1010 Acute atopic conjunctivitis, unspecified eye: Secondary | ICD-10-CM

## 2023-10-13 DIAGNOSIS — H04201 Unspecified epiphora, right lacrimal gland: Secondary | ICD-10-CM | POA: Diagnosis not present

## 2023-10-13 MED ORDER — LOTEPREDNOL ETABONATE 0.5 % OP SUSP: 1.0000 [drp] | Freq: Every morning | OPHTHALMIC | 0 refills | Status: AC

## 2023-10-13 MED ORDER — OLOPATADINE HCL 0.1 % OP SOLN: 1.0000 [drp] | Freq: Two times a day (BID) | OPHTHALMIC | 3 refills | Status: AC

## 2023-10-13 MED ORDER — FLUTICASONE PROPIONATE 50 MCG/ACT NA SUSP
1.0000 | Freq: Every day | NASAL | 3 refills | Status: AC
Start: 2023-10-13 — End: ?

## 2023-10-13 MED ORDER — LORATADINE 10 MG PO TABS: 10.0000 mg | ORAL_TABLET | Freq: Two times a day (BID) | ORAL | 3 refills | Status: AC | PRN

## 2023-10-13 NOTE — Patient Instructions (Addendum)
   1. Continue Flonase  1-2 sprays each nostril 1 time per day    2. Treat possible inflammation of right eye:   A.  Lotemax  -1 drop each eye 1 time per day for 1-2 weeks  B.  Evaluation with ophthalmologist if Lotemax  ineffective  3. If needed:   A. OTC antihistamine - Zyrtec/Claritin /Allegra 1-2 times a day  B. Pataday  1 drop each eye two times per day.  4. Return to clinic in 1 year or earlier if problem  5. Influenza = Tamiflu. Covid = Paxlovid

## 2023-10-13 NOTE — Progress Notes (Unsigned)
 [place] [facility] [place] [facility] [place] [facility] [place] [facility] [place]   Follow-up Note  Referring Provider: Shepard Ade, MD Primary Provider: Shepard Ade, MD Date of Office Visit: 10/13/2023  Subjective:   Dalton Lopez (DOB: 22-Dec-1960) is a 63 y.o. male who [facility] in re-evaluation of the following:  HPI: Dalton Lopez returns to this clinic in evaluation of allergic rhinoconjunctivitis.  I last saw him in this clinic 21 May 2021.  He has continued to do very well regarding his upper airway disease while consistently using a nasal steroid and an antihistamine.  He discontinued his immunotherapy mid spring.  He can have dog exposure now without any difficulty at all.  Sometimes the pollen exposure does cause some problems with his airway.  It does not sound as though he is required a systemic steroid or an antibiotic over the course of the past year for any type of airway issue.  He has had some issues with his right eye watering.  His left eye is fine.  Difficult to say exactly what the trigger is for this issue.  He does not really rub his eye very much.  Allergies as of 10/13/2023       Reactions   Dog Epithelium (canis Lupus Familiaris) Itching, Cough   Pollen Extract Cough        Medication List    ALPRAZolam 0.25 MG tablet Commonly known as: XANAX Take 0.125-0.25 mg by mouth every 6 (six) hours as needed.   diclofenac  75 MG EC tablet Commonly known as: VOLTAREN  Take 1 tablet by mouth daily.   EPINEPHrine  0.3 mg/0.3 mL Soaj injection Commonly known as: EpiPen  2-Pak Use for life-threatening allergic reactions.   Fish Oil 1200 MG Caps Take 1,200 mg by mouth daily.   fluticasone  50 MCG/ACT nasal spray Commonly known as: FLONASE  Place 1-2 sprays into both nostrils daily.   loratadine  10 MG tablet Commonly known as: CLARITIN  Take 1 tablet (10 mg total) by mouth 2 (two) times daily as needed for allergies (Can take an extra  dose during flare ups.).   loteprednol  0.5 % ophthalmic suspension Commonly known as: Lotemax  Place 1 drop into both eyes every morning. Use for one to two weeks then stop. Started by: Dalton Lopez Dalton Lopez   Magnesium 500 MG Caps Take by mouth daily.   multivitamin with minerals Tabs tablet Take 1 tablet by mouth daily.   olopatadine  0.1 % ophthalmic solution Commonly known as: Patanol Place 1 drop into the right eye 2 (two) times daily.   PARoxetine 20 MG tablet Commonly known as: PAXIL Take 10 mg by mouth daily.   tadalafil 5 MG tablet Commonly known as: CIALIS Take 5 mg by mouth daily as needed.    Past Medical History:  Diagnosis Date   Actinic keratosis    Allergy    seasonal   Anxiety    Arthritis    bil knees, back   Change in bowel habits    Constipation    DDD (degenerative disc disease), lumbar    History of kidney stones    History of moderate sun exposure    Post-operative nausea and vomiting    Skin cancer    basal cell , squamous cell   Thyroid  nodule    benign    Past Surgical History:  Procedure Laterality Date   ACHILLES TENDON REPAIR     right    EXTRACORPOREAL SHOCK WAVE LITHOTRIPSY Right 02/11/2021  Procedure: EXTRACORPOREAL SHOCK WAVE LITHOTRIPSY (ESWL);  Surgeon: Dalton Rush, MD;  Location: [facility];  Service: Urology;  Laterality: Right;   FOOT SURGERY Left 2011   joint fusion repair with screws left foot   KNEE ARTHROSCOPY Bilateral 11/2011   Bil   MEDIAL PARTIAL KNEE REPLACEMENT Left    MOHS SURGERY     multiple   SKIN BIOPSY  11/17/13   forehead, left temple, central nose tip    Review of systems negative except as noted in HPI / PMHx or noted below:  Review of Systems  Constitutional: Negative.   HENT: Negative.    Eyes: Negative.   Respiratory: Negative.    Cardiovascular: Negative.   Gastrointestinal: Negative.   Genitourinary: Negative.   Musculoskeletal: Negative.   Skin: Negative.   Neurological:  Negative.   Endo/Heme/Allergies: Negative.   Psychiatric/Behavioral: Negative.       Objective:   Vitals:   10/13/23 0909  BP: 110/64  Pulse: 80  Resp: 16  Temp: 98.1 F (36.7 C)  SpO2: 96%   Height: 6' 2 (188 cm)  Weight: 198 lb 9.6 oz (90.1 kg)   Physical Exam Constitutional:      Appearance: He is not diaphoretic.  HENT:     Head: Normocephalic.     Right Ear: Tympanic membrane, ear canal and external ear normal.     Left Ear: Tympanic membrane, ear canal and external ear normal.     Nose: Nose normal. No mucosal edema or rhinorrhea.     Mouth/Throat:     Pharynx: Uvula midline. No oropharyngeal exudate.  Eyes:     Conjunctiva/sclera: Conjunctivae normal.  Neck:     Thyroid : No thyromegaly.     Trachea: Trachea normal. No tracheal tenderness or tracheal deviation.  Cardiovascular:     Rate and Rhythm: Normal rate and regular rhythm.     Heart sounds: Normal heart sounds, S1 normal and S2 normal. No murmur heard. Pulmonary:     Effort: No respiratory distress.     Breath sounds: Normal breath sounds. No stridor. No wheezing or rales.  Lymphadenopathy:     Head:     Right side of head: No tonsillar adenopathy.     Left side of head: No tonsillar adenopathy.     Cervical: No cervical adenopathy.  Skin:    Findings: No erythema or rash.     Nails: There is no clubbing.  Neurological:     Mental Status: He is alert.     Diagnostics: none  Assessment and Plan:   1. Perennial allergic rhinitis   2. Seasonal allergic rhinitis due to pollen   3. Seasonal allergic conjunctivitis   4. Eye tearing, right     Patient Instructions     1. Continue Flonase  1-2 sprays each nostril 1 time per day    2. Treat possible inflammation of right eye:   A.  Lotemax  -1 drop each eye 1 time per day for 1-2 weeks  B.  Evaluation with ophthalmologist if Lotemax  ineffective  3. If needed:   A. OTC antihistamine - Zyrtec/Claritin /Allegra 1-2 times a day  B. Pataday  1  drop each eye two times per day.  4. Return to clinic in 1 year or earlier if problem  5. Influenza = Tamiflu. Covid = Paxlovid  It is not entirely clear why Dalton Lopez is having a problem with right eye watering.  This could be an inflammatory issue which we will address with a few weeks of Lotemax   or this could be an anatomical issue involving his tear duct for which he will need to have further evaluation with ophthalmology.  His airway disease appears to be going pretty well at this point in time and he will continue on some Flonase  and antihistamines.  If he does well I will see him back in his clinic in 1 year or earlier if there is a problem.  Camellia Denis, MD Allergy / Immunology [facility]

## 2023-10-14 ENCOUNTER — Encounter: Payer: Self-pay | Admitting: Allergy and Immunology

## 2023-11-30 ENCOUNTER — Ambulatory Visit
Admission: RE | Admit: 2023-11-30 | Discharge: 2023-11-30 | Disposition: A | Discharge status: Home / Self Care | Source: Ambulatory Visit | Attending: Orthopaedic Surgery | Admitting: Orthopaedic Surgery

## 2023-11-30 ENCOUNTER — Other Ambulatory Visit: Payer: Self-pay | Admitting: Orthopaedic Surgery

## 2023-11-30 DIAGNOSIS — M19072 Primary osteoarthritis, left ankle and foot: Secondary | ICD-10-CM

## 2024-10-11 ENCOUNTER — Ambulatory Visit: Admitting: Allergy and Immunology
